# Patient Record
Sex: Male | Born: 1965
Health system: Southern US, Community
[De-identification: ages and names within clinical notes are randomized; demographics above are authoritative.]

## PROBLEM LIST (undated history)

## (undated) DIAGNOSIS — F1910 Other psychoactive substance abuse, uncomplicated: Secondary | ICD-10-CM

## (undated) DIAGNOSIS — F419 Anxiety disorder, unspecified: Secondary | ICD-10-CM

## (undated) DIAGNOSIS — F329 Major depressive disorder, single episode, unspecified: Secondary | ICD-10-CM

## (undated) DIAGNOSIS — G9341 Metabolic encephalopathy: Secondary | ICD-10-CM

## (undated) DIAGNOSIS — F32A Depression, unspecified: Secondary | ICD-10-CM

---
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
## 2018-07-16 ENCOUNTER — Emergency Department (HOSPITAL_COMMUNITY): Payer: Self-pay

## 2018-07-16 ENCOUNTER — Other Ambulatory Visit: Payer: Self-pay

## 2018-07-16 ENCOUNTER — Emergency Department (HOSPITAL_COMMUNITY)
Admission: EM | Admit: 2018-07-16 | Discharge: 2018-07-16 | Disposition: A | Discharge status: Home / Self Care | Payer: Self-pay | Attending: Emergency Medicine | Admitting: Emergency Medicine

## 2018-07-16 ENCOUNTER — Encounter (HOSPITAL_COMMUNITY): Payer: Self-pay | Admitting: Emergency Medicine

## 2018-07-16 DIAGNOSIS — F17210 Nicotine dependence, cigarettes, uncomplicated: Secondary | ICD-10-CM | POA: Insufficient documentation

## 2018-07-16 DIAGNOSIS — T50904A Poisoning by unspecified drugs, medicaments and biological substances, undetermined, initial encounter: Secondary | ICD-10-CM | POA: Insufficient documentation

## 2018-07-16 DIAGNOSIS — R4689 Other symptoms and signs involving appearance and behavior: Secondary | ICD-10-CM | POA: Insufficient documentation

## 2018-07-16 HISTORY — DX: Depression, unspecified: F32.A

## 2018-07-16 HISTORY — DX: Anxiety disorder, unspecified: F41.9

## 2018-07-16 LAB — URINALYSIS, ROUTINE W REFLEX MICROSCOPIC
Bilirubin Urine: NEGATIVE
Glucose, UA: NEGATIVE mg/dL
Hgb urine dipstick: NEGATIVE
Ketones, ur: NEGATIVE mg/dL
Leukocytes,Ua: NEGATIVE
Nitrite: NEGATIVE
Protein, ur: NEGATIVE mg/dL
Specific Gravity, Urine: 1.006 (ref 1.005–1.030)
pH: 8 (ref 5.0–8.0)

## 2018-07-16 LAB — CBC
HCT: 36.4 % — ABNORMAL LOW (ref 39.0–52.0)
Hemoglobin: 12.6 g/dL — ABNORMAL LOW (ref 13.0–17.0)
MCH: 39.6 pg — ABNORMAL HIGH (ref 26.0–34.0)
MCHC: 34.6 g/dL (ref 30.0–36.0)
MCV: 114.5 fL — ABNORMAL HIGH (ref 80.0–100.0)
Platelets: 205 10*3/uL (ref 150–400)
RBC: 3.18 MIL/uL — ABNORMAL LOW (ref 4.22–5.81)
RDW: 15.6 % — ABNORMAL HIGH (ref 11.5–15.5)
WBC: 7.4 10*3/uL (ref 4.0–10.5)
nRBC: 0 % (ref 0.0–0.2)

## 2018-07-16 LAB — COMPREHENSIVE METABOLIC PANEL
ALT: 18 U/L (ref 0–44)
AST: 28 U/L (ref 15–41)
Albumin: 4.5 g/dL (ref 3.5–5.0)
Alkaline Phosphatase: 51 U/L (ref 38–126)
Anion gap: 11 (ref 5–15)
BUN: 6 mg/dL (ref 6–20)
CO2: 21 mmol/L — ABNORMAL LOW (ref 22–32)
Calcium: 9.2 mg/dL (ref 8.9–10.3)
Chloride: 115 mmol/L — ABNORMAL HIGH (ref 98–111)
Creatinine, Ser: 0.82 mg/dL (ref 0.61–1.24)
GFR calc Af Amer: >60 mL/min (ref >60)
GFR calc non Af Amer: >60 mL/min (ref >60)
Glucose, Bld: 113 mg/dL — ABNORMAL HIGH (ref 70–99)
Potassium: 4 mmol/L (ref 3.5–5.1)
Sodium: 147 mmol/L — ABNORMAL HIGH (ref 135–145)
Total Bilirubin: 1 mg/dL (ref 0.3–1.2)
Total Protein: 7.7 g/dL (ref 6.5–8.1)

## 2018-07-16 LAB — LACTIC ACID, PLASMA: Lactic Acid, Venous: 3.3 mmol/L (ref 0.5–1.9)

## 2018-07-16 LAB — RAPID URINE DRUG SCREEN, HOSP PERFORMED
Amphetamines: NOT DETECTED
Barbiturates: NOT DETECTED
Benzodiazepines: NOT DETECTED
Cocaine: POSITIVE — AB
Opiates: NOT DETECTED
Tetrahydrocannabinol: POSITIVE — AB

## 2018-07-16 LAB — ACETAMINOPHEN LEVEL: Acetaminophen (Tylenol), Serum: <10 ug/mL — ABNORMAL LOW (ref 10–30)

## 2018-07-16 LAB — SALICYLATE LEVEL: Salicylate Lvl: <7 mg/dL (ref 2.8–30.0)

## 2018-07-16 LAB — ETHANOL: Alcohol, Ethyl (B): 47 mg/dL — ABNORMAL HIGH (ref <10)

## 2018-07-16 MED ORDER — SODIUM CHLORIDE 0.9 % IV BOLUS: 1000.0000 mL | Freq: Once | INTRAVENOUS | Status: DC

## 2018-07-16 NOTE — ED Notes (Signed)
Patient ambulated independently to bathroom infront of nurse's station. Pt sat on the toilet for around 15 minutes before I went in to check on pt. Patient sitting on toilet with pants on. Asked patient if he needed to use the toilet and he stated "No my stomach hurts." Patient refused to pull pants down to sit on the toilet. Called security for assistance in redirecting patient back to his room. Patient is sleeping at this time.

## 2018-07-16 NOTE — ED Provider Notes (Signed)
[facility] Emergency Department Provider Note MRN:  [id number]  Arrival date & time: 07/16/18     Chief Complaint   Loss of Consciousness   History of Present Illness   John Grant is a 53 y.o. year-old male with a history of anxiety, depression presenting to the ED with chief complaint of loss of consciousness.  Patient was reportedly found minimally responsive at his home, brought in by EMS.  History of crack cocaine use years ago.  I was unable to obtain an accurate HPI, PMH, or ROS due to the patient's altered mental status.  Level 5 caveat.  Review of Systems  Positive for altered mental status.  Patient's Health History    Past Medical History:  Diagnosis Date  . Anxiety   . Depression     History reviewed. No pertinent surgical history.  History reviewed. No pertinent family history.  Social History   Socioeconomic History  . Marital status: Single    Spouse name: Not on file  . Number of children: Not on file  . Years of education: Not on file  . Highest education level: Not on file  Occupational History  . Not on file  Social Needs  . Financial resource strain: Not on file  . Food insecurity    Worry: Not on file    Inability: Not on file  . Transportation needs    Medical: Not on file    Non-medical: Not on file  Tobacco Use  . Smoking status: Heavy Tobacco Smoker    Packs/day: 0.50    Types: Cigarettes  . Smokeless tobacco: Never Used  Substance and Sexual Activity  . Alcohol use: Yes    Frequency: Never    Comment: occassionaly  . Drug use: Yes    Types: Marijuana    Comment: unknown  . Sexual activity: Not on file  Lifestyle  . Physical activity    Days per week: Not on file    Minutes per session: Not on file  . Stress: Not on file  Relationships  . Social Herbalist on phone: Not on file    Gets together: Not on file    Attends religious service: Not on file    Active member of club or organization:  Not on file    Attends meetings of clubs or organizations: Not on file    Relationship status: Not on file  . Intimate partner violence    Fear of current or ex partner: Not on file    Emotionally abused: Not on file    Physically abused: Not on file    Forced sexual activity: Not on file  Other Topics Concern  . Not on file  Social History Narrative  . Not on file     Physical Exam  Vital Signs and Nursing Notes reviewed Vitals:   07/16/18 1954 07/16/18 2000  BP: (!) 157/102 137/90  Pulse: 88 74  Resp: 19 18  Temp:    SpO2: 100% 99%    CONSTITUTIONAL: Well-appearing, NAD NEURO: Somnolent, wakes to pain stimulus, perseverating, moving all extremities EYES:  eyes equal and reactive, pupils 4 mm ENT/NECK:  no LAD, no JVD CARDIO: Regular rate, well-perfused, normal S1 and S2 PULM:  CTAB no wheezing or rhonchi GI/GU:  normal bowel sounds, non-distended, non-tender MSK/SPINE:  No gross deformities, no edema SKIN:  no rash, atraumatic PSYCH:  Appropriate speech and behavior  Diagnostic and Interventional Summary    EKG Interpretation  Date/Time:  Tuesday July 16 2018 17:12:56 EDT Ventricular Rate:  69 PR Interval:    QRS Duration: 97 QT Interval:  435 QTC Calculation: 466 R Axis:   64 Text Interpretation:  Sinus rhythm Confirmed by Kennis CarinaBero, Shawnita Krizek [phone]([id number]) on 07/16/2018 7:35:18 PM      Labs Reviewed  CBC - Abnormal; Notable for the following components:      Result Value   RBC 3.18 (*)    Hemoglobin 12.6 (*)    HCT 36.4 (*)    MCV 114.5 (*)    MCH 39.6 (*)    RDW 15.6 (*)    All other components within normal limits  COMPREHENSIVE METABOLIC PANEL - Abnormal; Notable for the following components:   Sodium 147 (*)    Chloride 115 (*)    CO2 21 (*)    Glucose, Bld 113 (*)    All other components within normal limits  LACTIC ACID, PLASMA - Abnormal; Notable for the following components:   Lactic Acid, Venous 3.3 (*)    All other components within normal limits   URINALYSIS, ROUTINE W REFLEX MICROSCOPIC - Abnormal; Notable for the following components:   Color, Urine STRAW (*)    All other components within normal limits  ETHANOL - Abnormal; Notable for the following components:   Alcohol, Ethyl (B) 47 (*)    All other components within normal limits  ACETAMINOPHEN LEVEL - Abnormal; Notable for the following components:   Acetaminophen (Tylenol), Serum <10 (*)    All other components within normal limits  RAPID URINE DRUG SCREEN, HOSP PERFORMED - Abnormal; Notable for the following components:   Cocaine POSITIVE (*)    Tetrahydrocannabinol POSITIVE (*)    All other components within normal limits  SALICYLATE LEVEL  RAPID HIV SCREEN (HIV 1/2 AB+AG)  HEPATITIS B SURFACE ANTIGEN  HEPATITIS C ANTIBODY (REFLEX)    CT HEAD WO CONTRAST  Final Result    DG Chest Port 1 View  Final Result      Medications  sodium chloride 0.9 % bolus 1,000 mL (1,000 mLs Intravenous Refused 07/16/18 1818)     Procedures Critical Care  ED Course and Medical Decision Making  I have reviewed the triage vital signs and the nursing notes.  Pertinent labs & imaging results that were available during my care of the patient were reviewed by me and considered in my medical decision making (see below for details).  Altered mental status in this 53 year old male, pupils are 4 mm, making this inconsistent with opioid toxidrome.  He has normal vital signs, no clonus, moving all extremities, currently intermittently talking but repeating himself.  Suspect drug use given his history, also considering metabolic disarray, CNS abnormality, work-up is pending.  Update 6:10 PM: Called to bedside for patient ripping out his leads and IV, I was able to question the patient who cannot tell me what year it is, still very altered, risk of harm to self, luckily we were able to convince him to stay in his bed and will monitor him closely.  Work-up reveals normal labs, normal head CT,  normal urinalysis, UDS testing positive for cocaine and THC, ethanol also slightly elevated.  Chest x-ray read as bilateral infiltrates however patient has no respiratory symptoms or cough or fever and the significance of this is questioned.  Upon reevaluation, patient is now alert and oriented, ambulating without assistance, appropriate for discharge.  Presumed drug overdose, likely unintentional, patient denying SI or HI.  After the discussed management above, the patient was  determined to be safe for discharge.  The patient was in agreement with this plan and all questions regarding their care were answered.  ED return precautions were discussed and the patient will return to the ED with any significant worsening of condition.  Elmer SowMichael M. Pilar PlateBero, MD [facility] [email or web address]  Final Clinical Impressions(s) / ED Diagnoses     ICD-10-CM   1. Drug overdose, undetermined intent, initial encounter  T50.904A   2. Altered behavior  R46.89 [facility]    [facility] [place] [facility]    ED Discharge Orders    None         Sabas SousBero, Falynn Ailey M, MD 07/16/18 2136

## 2018-07-16 NOTE — ED Notes (Signed)
Patient tolerating oral fluids  

## 2018-07-16 NOTE — ED Triage Notes (Signed)
PT brought in by [facility] today for decreased LOC. It was reported to EMS that pt had been outdoors on a porch for hours today and had walked to the store down the road around 1530 but was shaking some and appeared to be nauseated at that time per his niece. PT unable to provided any information but will respond to verbal and painful stimuli and pupils are equal and reactive. PT was diaphoretic when EMS arrived and got on the stretcher and then became less responsive in route per EMS. CBG 177 and VS WNL.

## 2018-07-16 NOTE — Discharge Instructions (Addendum)
You were evaluated in the Emergency Department and after careful evaluation, we did not find any emergent condition requiring admission or further testing in the hospital. ° °Please return to the Emergency Department if you experience any worsening of your condition.  We encourage you to follow up with a primary care provider.  Thank you for allowing us to be a part of your care. °

## 2018-07-16 NOTE — ED Notes (Signed)
Esignature pad not working in room. Patient verbally stated he understands D/C instructions

## 2018-07-16 NOTE — ED Notes (Signed)
Patient standing up yelling "I'm leaving" upon entrance into room. Attempted to redirect patient back to his room. Patient refused and continued walking out of room. Security called and EDP notified. Patient ripped IV out and was bleeding heavily out of IV site onto self, floor and staff member's clothes. Patient assisted back into bed. Patient sleeping at this time.

## 2018-07-16 NOTE — ED Notes (Signed)
Spoke with Diamond Nickel regarding pt update and current status. Levada Dy stated she would be picking pt up from ED after D/C

## 2018-07-16 NOTE — ED Notes (Signed)
Date and time results received: 07/16/18 8:30 PM  (use smartphrase ".now" to insert current time)  Test: lactic acid Critical Value: 3.3  Name of Provider Notified: Sedonia Small, MD  Orders Received? Or Actions Taken?: N/A

## 2018-07-18 LAB — HEPATITIS B SURFACE ANTIGEN: Hepatitis B Surface Ag: NEGATIVE

## 2018-07-18 LAB — HEPATITIS C ANTIBODY (REFLEX): HCV Ab: <0.1 s/co ratio (ref 0.0–0.9)

## 2018-07-18 LAB — HCV COMMENT:

## 2018-07-23 LAB — RAPID HIV SCREEN (HIV 1/2 AB+AG)
HIV 1/2 Antibodies: NONREACTIVE
HIV-1 P24 Antigen - HIV24: NONREACTIVE

## 2018-08-15 ENCOUNTER — Emergency Department (HOSPITAL_COMMUNITY): Payer: Self-pay

## 2018-08-15 ENCOUNTER — Observation Stay (HOSPITAL_COMMUNITY)
Admission: EM | Admit: 2018-08-15 | Discharge: 2018-08-16 | Disposition: A | Discharge status: Home / Self Care | Payer: Self-pay | Attending: Family Medicine | Admitting: Family Medicine

## 2018-08-15 ENCOUNTER — Other Ambulatory Visit: Payer: Self-pay

## 2018-08-15 ENCOUNTER — Encounter (HOSPITAL_COMMUNITY): Payer: Self-pay | Admitting: Emergency Medicine

## 2018-08-15 DIAGNOSIS — R4182 Altered mental status, unspecified: Secondary | ICD-10-CM

## 2018-08-15 DIAGNOSIS — Z20828 Contact with and (suspected) exposure to other viral communicable diseases: Secondary | ICD-10-CM | POA: Insufficient documentation

## 2018-08-15 DIAGNOSIS — F1410 Cocaine abuse, uncomplicated: Secondary | ICD-10-CM | POA: Insufficient documentation

## 2018-08-15 DIAGNOSIS — G9341 Metabolic encephalopathy: Principal | ICD-10-CM | POA: Insufficient documentation

## 2018-08-15 DIAGNOSIS — R400 Somnolence: Secondary | ICD-10-CM

## 2018-08-15 DIAGNOSIS — F17210 Nicotine dependence, cigarettes, uncomplicated: Secondary | ICD-10-CM | POA: Insufficient documentation

## 2018-08-15 LAB — CBC WITH DIFFERENTIAL/PLATELET
Abs Immature Granulocytes: 0.03 10*3/uL (ref 0.00–0.07)
Basophils Absolute: 0 10*3/uL (ref 0.0–0.1)
Basophils Relative: 0 %
Eosinophils Absolute: 0.1 10*3/uL (ref 0.0–0.5)
Eosinophils Relative: 1 %
HCT: 36.5 % — ABNORMAL LOW (ref 39.0–52.0)
Hemoglobin: 12.7 g/dL — ABNORMAL LOW (ref 13.0–17.0)
Immature Granulocytes: 0 %
Lymphocytes Relative: 20 %
Lymphs Abs: 2 10*3/uL (ref 0.7–4.0)
MCH: 39.6 pg — ABNORMAL HIGH (ref 26.0–34.0)
MCHC: 34.8 g/dL (ref 30.0–36.0)
MCV: 113.7 fL — ABNORMAL HIGH (ref 80.0–100.0)
Monocytes Absolute: 0.6 10*3/uL (ref 0.1–1.0)
Monocytes Relative: 6 %
Neutro Abs: 7.2 10*3/uL (ref 1.7–7.7)
Neutrophils Relative %: 73 %
Platelets: 241 10*3/uL (ref 150–400)
RBC: 3.21 MIL/uL — ABNORMAL LOW (ref 4.22–5.81)
RDW: 14.2 % (ref 11.5–15.5)
WBC: 10 10*3/uL (ref 4.0–10.5)
nRBC: 0.2 % (ref 0.0–0.2)

## 2018-08-15 LAB — SALICYLATE LEVEL: Salicylate Lvl: <7 mg/dL (ref 2.8–30.0)

## 2018-08-15 LAB — COMPREHENSIVE METABOLIC PANEL
ALT: 16 U/L (ref 0–44)
AST: 24 U/L (ref 15–41)
Albumin: 4.4 g/dL (ref 3.5–5.0)
Alkaline Phosphatase: 54 U/L (ref 38–126)
Anion gap: 11 (ref 5–15)
BUN: 8 mg/dL (ref 6–20)
CO2: 20 mmol/L — ABNORMAL LOW (ref 22–32)
Calcium: 8.9 mg/dL (ref 8.9–10.3)
Chloride: 112 mmol/L — ABNORMAL HIGH (ref 98–111)
Creatinine, Ser: 0.92 mg/dL (ref 0.61–1.24)
GFR calc Af Amer: >60 mL/min (ref >60)
GFR calc non Af Amer: >60 mL/min (ref >60)
Glucose, Bld: 121 mg/dL — ABNORMAL HIGH (ref 70–99)
Potassium: 3.4 mmol/L — ABNORMAL LOW (ref 3.5–5.1)
Sodium: 143 mmol/L (ref 135–145)
Total Bilirubin: 1.1 mg/dL (ref 0.3–1.2)
Total Protein: 7.5 g/dL (ref 6.5–8.1)

## 2018-08-15 LAB — RAPID URINE DRUG SCREEN, HOSP PERFORMED
Amphetamines: NOT DETECTED
Barbiturates: NOT DETECTED
Benzodiazepines: NOT DETECTED
Cocaine: POSITIVE — AB
Opiates: NOT DETECTED
Tetrahydrocannabinol: POSITIVE — AB

## 2018-08-15 LAB — URINALYSIS, ROUTINE W REFLEX MICROSCOPIC
Bacteria, UA: NONE SEEN
Bilirubin Urine: NEGATIVE
Glucose, UA: NEGATIVE mg/dL
Hgb urine dipstick: NEGATIVE
Ketones, ur: NEGATIVE mg/dL
Leukocytes,Ua: NEGATIVE
Nitrite: NEGATIVE
Protein, ur: 30 mg/dL — AB
Specific Gravity, Urine: 1.018 (ref 1.005–1.030)
pH: 8 (ref 5.0–8.0)

## 2018-08-15 LAB — CBG MONITORING, ED: Glucose-Capillary: 142 mg/dL — ABNORMAL HIGH (ref 70–99)

## 2018-08-15 LAB — TYPE AND SCREEN
ABO/RH(D): O POS
Antibody Screen: NEGATIVE

## 2018-08-15 LAB — ACETAMINOPHEN LEVEL: Acetaminophen (Tylenol), Serum: <10 ug/mL — ABNORMAL LOW (ref 10–30)

## 2018-08-15 LAB — ETHANOL: Alcohol, Ethyl (B): 15 mg/dL — ABNORMAL HIGH (ref <10)

## 2018-08-15 LAB — SARS CORONAVIRUS 2 BY RT PCR (HOSPITAL ORDER, PERFORMED IN ~~LOC~~ HOSPITAL LAB): SARS Coronavirus 2: NEGATIVE

## 2018-08-15 LAB — LACTIC ACID, PLASMA
Lactic Acid, Venous: 3.8 mmol/L (ref 0.5–1.9)
Lactic Acid, Venous: 2.7 mmol/L (ref 0.5–1.9)

## 2018-08-15 MED ORDER — THIAMINE HCL 100 MG/ML IJ SOLN: 100.0000 mg | Freq: Every day | INTRAMUSCULAR | Status: DC

## 2018-08-15 MED ORDER — POLYETHYLENE GLYCOL 3350 17 G PO PACK: 17.0000 g | PACK | Freq: Every day | ORAL | Status: DC | PRN

## 2018-08-15 MED ORDER — ONDANSETRON HCL 4 MG PO TABS: 4.0000 mg | ORAL_TABLET | Freq: Four times a day (QID) | ORAL | Status: DC | PRN

## 2018-08-15 MED ORDER — METHOCARBAMOL 500 MG PO TABS
750.0000 mg | ORAL_TABLET | Freq: Four times a day (QID) | ORAL | Status: DC
  Administered 2018-08-15 – 2018-08-16 (×5): 750 mg via ORAL
  Filled 2018-08-15 (×6): qty 2

## 2018-08-15 MED ORDER — POLYETHYLENE GLYCOL 3350 17 G PO PACK
17.0000 g | PACK | Freq: Every day | ORAL | Status: DC
  Administered 2018-08-16: 17 g via ORAL
  Filled 2018-08-15: qty 1

## 2018-08-15 MED ORDER — ACETAMINOPHEN 650 MG RE SUPP: 650.0000 mg | Freq: Four times a day (QID) | RECTAL | Status: DC | PRN

## 2018-08-15 MED ORDER — GABAPENTIN 300 MG PO CAPS
300.0000 mg | ORAL_CAPSULE | Freq: Three times a day (TID) | ORAL | Status: DC
  Administered 2018-08-15 – 2018-08-16 (×4): 300 mg via ORAL
  Filled 2018-08-15 (×4): qty 1

## 2018-08-15 MED ORDER — LORAZEPAM 2 MG/ML IJ SOLN
2.0000 mg | Freq: Once | INTRAMUSCULAR | Status: AC
  Administered 2018-08-15: 2 mg via INTRAVENOUS
  Filled 2018-08-15: qty 1

## 2018-08-15 MED ORDER — TRAZODONE HCL 50 MG PO TABS: 50.0000 mg | ORAL_TABLET | Freq: Every evening | ORAL | Status: DC | PRN

## 2018-08-15 MED ORDER — SODIUM CHLORIDE 0.9 % IV SOLN
INTRAVENOUS | Status: DC
  Administered 2018-08-15: 14:00:00 via INTRAVENOUS

## 2018-08-15 MED ORDER — HYDROXYZINE HCL 25 MG PO TABS
25.0000 mg | ORAL_TABLET | Freq: Three times a day (TID) | ORAL | Status: DC
  Administered 2018-08-15 – 2018-08-16 (×4): 25 mg via ORAL
  Filled 2018-08-15 (×4): qty 1

## 2018-08-15 MED ORDER — MAGNESIUM SULFATE 2 GM/50ML IV SOLN
2.0000 g | Freq: Once | INTRAVENOUS | Status: AC
  Administered 2018-08-15: 19:00:00 2 g via INTRAVENOUS
  Filled 2018-08-15: qty 50

## 2018-08-15 MED ORDER — SODIUM CHLORIDE 0.9 % IV SOLN: 250.0000 mL | INTRAVENOUS | Status: DC | PRN

## 2018-08-15 MED ORDER — ADULT MULTIVITAMIN W/MINERALS CH
1.0000 | ORAL_TABLET | Freq: Every day | ORAL | Status: DC
  Administered 2018-08-16: 1 via ORAL
  Filled 2018-08-15: qty 1

## 2018-08-15 MED ORDER — ACETAMINOPHEN 325 MG PO TABS: 650.0000 mg | ORAL_TABLET | Freq: Four times a day (QID) | ORAL | Status: DC | PRN

## 2018-08-15 MED ORDER — ALBUTEROL SULFATE (2.5 MG/3ML) 0.083% IN NEBU: 2.5000 mg | INHALATION_SOLUTION | RESPIRATORY_TRACT | Status: DC | PRN

## 2018-08-15 MED ORDER — ONDANSETRON HCL 4 MG/2ML IJ SOLN: 4.0000 mg | Freq: Four times a day (QID) | INTRAMUSCULAR | Status: DC | PRN

## 2018-08-15 MED ORDER — LORAZEPAM 1 MG PO TABS: 1.0000 mg | ORAL_TABLET | Freq: Four times a day (QID) | ORAL | Status: DC | PRN

## 2018-08-15 MED ORDER — TRAZODONE HCL 50 MG PO TABS
150.0000 mg | ORAL_TABLET | Freq: Every day | ORAL | Status: DC
  Administered 2018-08-15: 22:00:00 150 mg via ORAL
  Filled 2018-08-15: qty 3

## 2018-08-15 MED ORDER — VITAMIN B-1 100 MG PO TABS
100.0000 mg | ORAL_TABLET | Freq: Every day | ORAL | Status: DC
  Administered 2018-08-16: 100 mg via ORAL
  Filled 2018-08-15: qty 1

## 2018-08-15 MED ORDER — HEPARIN SODIUM (PORCINE) 5000 UNIT/ML IJ SOLN
5000.0000 [IU] | Freq: Three times a day (TID) | INTRAMUSCULAR | Status: DC
  Administered 2018-08-15 – 2018-08-16 (×3): 5000 [IU] via SUBCUTANEOUS
  Filled 2018-08-15 (×3): qty 1

## 2018-08-15 MED ORDER — KCL IN DEXTROSE-NACL 20-5-0.45 MEQ/L-%-% IV SOLN
INTRAVENOUS | Status: DC
  Administered 2018-08-15 – 2018-08-16 (×4): via INTRAVENOUS
  Filled 2018-08-15 (×16): qty 1000

## 2018-08-15 MED ORDER — PANTOPRAZOLE SODIUM 40 MG IV SOLR
40.0000 mg | Freq: Once | INTRAVENOUS | Status: AC
  Administered 2018-08-15: 40 mg via INTRAVENOUS
  Filled 2018-08-15: qty 40

## 2018-08-15 MED ORDER — LORAZEPAM 2 MG/ML IJ SOLN: 1.0000 mg | Freq: Four times a day (QID) | INTRAMUSCULAR | Status: DC | PRN

## 2018-08-15 MED ORDER — SODIUM CHLORIDE 0.9% FLUSH
3.0000 mL | Freq: Two times a day (BID) | INTRAVENOUS | Status: DC
  Administered 2018-08-16: 3 mL via INTRAVENOUS

## 2018-08-15 MED ORDER — FOLIC ACID 1 MG PO TABS
1.0000 mg | ORAL_TABLET | Freq: Every day | ORAL | Status: DC
  Administered 2018-08-16: 10:00:00 1 mg via ORAL
  Filled 2018-08-15: qty 1

## 2018-08-15 MED ORDER — SENNOSIDES-DOCUSATE SODIUM 8.6-50 MG PO TABS
2.0000 | ORAL_TABLET | Freq: Two times a day (BID) | ORAL | Status: DC
  Administered 2018-08-16: 2 via ORAL
  Filled 2018-08-15 (×9): qty 2

## 2018-08-15 MED ORDER — SODIUM CHLORIDE 0.9% FLUSH
3.0000 mL | INTRAVENOUS | Status: DC | PRN
  Administered 2018-08-15 (×2): 3 mL via INTRAVENOUS
  Filled 2018-08-15 (×2): qty 3

## 2018-08-15 NOTE — ED Notes (Signed)
Date and time results received: 08/15/18 1259 (use smartphrase ".now" to insert current time)  Test: lactic Critical Value: 3.8  Name of Provider Notified: kohut,md  Orders Received? Or Actions Taken?:

## 2018-08-15 NOTE — ED Provider Notes (Signed)
[facility] EMERGENCY DEPARTMENT Provider Note   CSN: [id number] Arrival date & time: 08/15/18  1142     History   Chief Complaint Chief Complaint  Patient presents with  . Altered Mental Status    HPI John Grant is a 53 y.o. male.     HPI  53yM with AMS. Apparently was in normal state of health when he went out to mow the lawn. Found inside a short time later just standing there. Wouldn't speak and acting bizarrely so EMS was called. Reported history of ETOH and drug abuse. Of note, Pt was seen in the ER on 07/16/18 with altered mental status presumed to be substance related.   Past Medical History:  Diagnosis Date  . Anxiety   . Depression    There are no active problems to display for this patient.  History reviewed. No pertinent surgical history.    Home Medications    Prior to Admission medications   Not on File    Family History History reviewed. No pertinent family history.  Social History Social History   Tobacco Use  . Smoking status: Heavy Tobacco Smoker    Packs/day: 0.50    Types: Cigarettes  . Smokeless tobacco: Never Used  Substance Use Topics  . Alcohol use: Yes    Frequency: Never    Comment: daily  . Drug use: Yes    Types: Marijuana    Comment: unknown     Allergies   Patient has no known allergies.   Review of Systems Review of Systems  Level 5 caveat because pt is nonverbal.  Physical Exam Updated Vital Signs BP (!) 155/98   Pulse 72   Temp 98.3 F (36.8 C)   Resp 16   SpO2 99%   Physical Exam Vitals signs and nursing note reviewed.  Constitutional:      Appearance: He is well-developed. He is diaphoretic.  HENT:     Head: Normocephalic.     Comments: L upper incisor broken at gumline. R upper incisor cracked. Blood noted on lips/gums but no clear source aside from possibly teeth. Pt clenching teeth hard whenever I attempt to manually open mouth.  Eyes:     General:        Right eye: No discharge.        Left  eye: No discharge.     Conjunctiva/sclera: Conjunctivae normal.  Neck:     Musculoskeletal: Neck supple.  Cardiovascular:     Rate and Rhythm: Normal rate and regular rhythm.     Heart sounds: Normal heart sounds. No murmur. No friction rub. No gallop.   Pulmonary:     Effort: Pulmonary effort is normal. No respiratory distress.     Breath sounds: Normal breath sounds.  Abdominal:     General: There is no distension.     Palpations: Abdomen is soft.     Tenderness: There is no abdominal tenderness.  Musculoskeletal:        General: No tenderness.  Skin:    General: Skin is warm.  Neurological:     Comments: Fighting strongly with all extremities when catheterizing for urine. Not following commands. Not speaking. Not making eye contact but clearly moves eyes past midline.       ED Treatments / Results  Labs (all labs ordered are listed, but only abnormal results are displayed) Labs Reviewed  COMPREHENSIVE METABOLIC PANEL - Abnormal; Notable for the following components:      Result Value   Potassium 3.4 (*)  Chloride 112 (*)    CO2 20 (*)    Glucose, Bld 121 (*)    All other components within normal limits  RAPID URINE DRUG SCREEN, HOSP PERFORMED - Abnormal; Notable for the following components:   Cocaine POSITIVE (*)    Tetrahydrocannabinol POSITIVE (*)    All other components within normal limits  URINALYSIS, ROUTINE W REFLEX MICROSCOPIC - Abnormal; Notable for the following components:   Protein, ur 30 (*)    All other components within normal limits  CBC WITH DIFFERENTIAL/PLATELET - Abnormal; Notable for the following components:   RBC 3.21 (*)    Hemoglobin 12.7 (*)    HCT 36.5 (*)    MCV 113.7 (*)    MCH 39.6 (*)    All other components within normal limits  ACETAMINOPHEN LEVEL - Abnormal; Notable for the following components:   Acetaminophen (Tylenol), Serum <10 (*)    All other components within normal limits  LACTIC ACID, PLASMA - Abnormal; Notable for  the following components:   Lactic Acid, Venous 3.8 (*)    All other components within normal limits  ETHANOL - Abnormal; Notable for the following components:   Alcohol, Ethyl (B) 15 (*)    All other components within normal limits  CBG MONITORING, ED - Abnormal; Notable for the following components:   Glucose-Capillary 142 (*)    All other components within normal limits  SARS CORONAVIRUS 2 (HOSPITAL ORDER, PERFORMED IN [facility])  SALICYLATE LEVEL  LACTIC ACID, PLASMA  TYPE AND SCREEN    EKG EKG Interpretation  Date/Time:  Thursday August 15 2018 11:45:59 EDT Ventricular Rate:  101 PR Interval:    QRS Duration: 82 QT Interval:  356 QTC Calculation: 462 R Axis:   47 Text Interpretation:  Sinus tachycardia Baseline wander in lead(s) V1 Confirmed by Raeford RazorKohut, Clover Feehan [phone]([id number]) on 08/15/2018 12:40:31 PM   Radiology Ct Head Wo Contrast  Result Date: 08/15/2018 CLINICAL DATA:  Altered mental status. EXAM: CT HEAD WITHOUT CONTRAST TECHNIQUE: Contiguous axial images were obtained from the base of the skull through the vertex without intravenous contrast. COMPARISON:  07/16/2018 FINDINGS: Brain: The ventricles are normal in size and configuration. No extra-axial fluid collections are identified. The gray-white differentiation is maintained. No CT findings for acute hemispheric infarction or intracranial hemorrhage. No mass lesions. The brainstem and cerebellum are normal. Vascular: No hyperdense vessels or obvious aneurysm. Skull: No acute skull fracture.  No bone lesion. Sinuses/Orbits: The paranasal sinuses and mastoid air cells are clear. The globes are intact. Other: No scalp lesions, laceration or hematoma. IMPRESSION: Normal head CT. Electronically Signed   By: Rudie MeyerP.  Gallerani M.D.   On: 08/15/2018 13:12    Procedures Procedures (including critical care time)  Medications Ordered in ED Medications  LORazepam (ATIVAN) injection 2 mg (has no administration in time range)   pantoprazole (PROTONIX) injection 40 mg (has no administration in time range)     Initial Impression / Assessment and Plan / ED Course  I have reviewed the triage vital signs and the nursing notes.  Pertinent labs & imaging results that were available during my care of the patient were reviewed by me and considered in my medical decision making (see chart for details).     53yM with AMS. Not much additional history aside from hx of substance abuse and ED visit one month ago with confusion felt to be from drug ingestion.  On arrival pt apparently vomited blood. Over 100cc of dark blood noted in suction  canister. Pt clenching teeth and I cannot open his mouth. Possibly acute trauma to upper incisors but amount of blood seems out of proportion to this. Will have to sedate for imaging and will try to better assess for intraoral trauma. Reported history of ETOH abuse. Consider UGIB/varices. At this point I do not have a clear source. Will obtain T&S and give a dose of protonix for now.   He is currently very altered. Moves all extremities with good strength but nonverbal and not following commands. Not making eye contact but eyes move past midline. Afebrile. Glucose fine.   W/u fairly unremarkable aside from UDS and etoh barely elevated. I suspect this is either substance induced or possibly a seizure. He remains very drowsy but protecting his airway. Had to give him ativan to obtain imaging. Will admit for observation. I still can't identified source of bleeding. I don't think it's his teeth. On repeat exam it just looks like very poor dentition. I don't see any blood intraorally. No further blood from that initial episode once her arrived to the ED.   Updated his sister John Grant, [phone]. She couldn't provide additional history as to what may have happened today.  Final Clinical Impressions(s) / ED Diagnoses   Final diagnoses:  Somnolence  Altered mental status, unspecified altered  mental status type    ED Discharge Orders    None       Virgel Manifold, MD 08/16/18 [phone]

## 2018-08-15 NOTE — ED Notes (Signed)
Date and time results received: 08/15/18 1519 (use smartphrase ".now" to insert current time)  Test: lactic acid Critical Value: 2.7  Name of Provider Notified: Kohut,MD  Orders Received? Or Actions Taken?:

## 2018-08-15 NOTE — ED Notes (Signed)
Pt's wallet, bracelet, one earring given to security

## 2018-08-15 NOTE — ED Notes (Signed)
Pt's brother at bedside, doesn't know what happened today, unsure of PMH

## 2018-08-15 NOTE — ED Triage Notes (Signed)
Pt from home with mother. Mother called EMS for altered mental status. Pt was mowing grass, went inside and was standing, not talking and altered. HX of substance abuse and alcohol

## 2018-08-15 NOTE — ED Notes (Signed)
ED Provider at bedside. 

## 2018-08-15 NOTE — H&P (Signed)
Patient Demographics:    John Grant, is a 53 y.o. male  MRN: [id number]   DOB - [birth year]  Admit Date - 08/15/2018  Outpatient Primary MD for the patient is Patient, No Pcp Per   Assessment & Plan:    Principal Problem:   Acute metabolic encephalopathy Active Problems:   Cocaine abuse (HCC)    1) acute toxic metabolic encephalopathy--- suspect due to substance abuse, monitor closely, UDS positive for cocaine, -Hydrate until awake enough to take oral intake -N.p.o. while lethargic --Consider seizure work-up if lethargy and altered mentation persist after hydration with passage of time Check EEG  2) polysubstance abuse including cocaine--- okay to use methocarbamol, hydroxyzine, and gabapentin to help with withdrawal symptoms  3) history of alcohol abuse--blood alcohol level was noted, give multivitamin and lorazepam per CIWA protocol -High risk for DTs -Trazodone for sleep  With History of - Reviewed by me  Past Medical History:  Diagnosis Date  . Anxiety   . Depression       History reviewed. No pertinent surgical history.   Chief Complaint  Patient presents with  . Altered Mental Status      HPI:    John StandingHenry Grant  is a 53 y.o. male history of alcohol abuse as well as polysubstance abuse including cocaine recently seen here in the ED on 07/16/2018 with altered mentation returns again today with bizarre behavior and altered mental status  --UDS positive for cocaine and THC, blood alcohol level was noted --CT head without contrast from 07/16/2018 and 08/15/2018 without acute findings --Lactic acid was elevated at 3.8 came down to 2.7 with hydration -Salicylate and Tylenol levels are not elevated, hemoglobin is 12.7, -Potassium down to 3.4, magnesium not available   Review of systems:    In  addition to the HPI above,   A full Review of  Systems was done, all other systems reviewed are negative except as noted above in HPI , .   Social History:  Reviewed by me    Social History   Tobacco Use  . Smoking status: Heavy Tobacco Smoker    Packs/day: 0.50    Types: Cigarettes  . Smokeless tobacco: Never Used  Substance Use Topics  . Alcohol use: Yes    Frequency: Never    Comment: daily       Family History :  Reviewed by me  HTN  Home Medications:   Prior to Admission medications   Medication Sig Start Date End Date Taking? Authorizing Provider  citalopram (CELEXA) 20 MG tablet Take 20 mg by mouth daily.    [provider]  traZODone (DESYREL) 100 MG tablet Take 100 mg by mouth at bedtime as needed for sleep.    [provider]     Allergies:    No Known Allergies   Physical Exam:   Vitals  Blood pressure (!) 143/93, pulse 84, temperature 98.3 F (36.8 C), resp. rate 16, SpO2 100 %.  Physical Examination: General appearance -sleepy but arousable mental status -sleepy  neck - supple, no JVD elevation , Chest - clear  to auscultation bilaterally, symmetrical air movement,  Heart - S1 and S2 normal, regular  Abdomen - soft, nontender, nondistended, no masses or organomegaly Neurological -Limited exam as patient is lethargic but he is moving all extremities well,  extremities - no pedal edema noted, intact peripheral pulses  Skin - warm, dry   Data Review:    CBC Recent Labs  Lab 08/15/18 1217  WBC 10.0  HGB 12.7*  HCT 36.5*  PLT 241  MCV 113.7*  MCH 39.6*  MCHC 34.8  RDW 14.2  LYMPHSABS 2.0  MONOABS 0.6  EOSABS 0.1  BASOSABS 0.0   ------------------------------------------------------------------------------------------------------------------  Chemistries  Recent Labs  Lab 08/15/18 1217  NA 143  K 3.4*  CL 112*  CO2 20*  GLUCOSE 121*  BUN 8  CREATININE 0.92  CALCIUM 8.9  AST 24  ALT 16  ALKPHOS 54   BILITOT 1.1   ------------------------------------------------------------------------------------------------------------------ CrCl cannot be calculated (Unknown ideal weight.). ------------------------------------------------------------------------------------------------------------------ No results for input(s): TSH, T4TOTAL, T3FREE, THYROIDAB in the last 72 hours.  Invalid input(s): FREET3   Coagulation profile No results for input(s): INR, PROTIME in the last 168 hours. ------------------------------------------------------------------------------------------------------------------- No results for input(s): DDIMER in the last 72 hours. -------------------------------------------------------------------------------------------------------------------  Cardiac Enzymes No results for input(s): CKMB, TROPONINI, MYOGLOBIN in the last 168 hours.  Invalid input(s): CK ------------------------------------------------------------------------------------------------------------------ No results found for: BNP   ---------------------------------------------------------------------------------------------------------------  Urinalysis    Component Value Date/Time   COLORURINE YELLOW 08/15/2018 1158   APPEARANCEUR CLEAR 08/15/2018 1158   LABSPEC 1.018 08/15/2018 1158   PHURINE 8.0 08/15/2018 1158   GLUCOSEU NEGATIVE 08/15/2018 1158   HGBUR NEGATIVE 08/15/2018 1158   BILIRUBINUR NEGATIVE 08/15/2018 1158   KETONESUR NEGATIVE 08/15/2018 1158   PROTEINUR 30 (A) 08/15/2018 1158   NITRITE NEGATIVE 08/15/2018 1158   LEUKOCYTESUR NEGATIVE 08/15/2018 1158    ----------------------------------------------------------------------------------------------------------------   Imaging Results:    Ct Head Wo Contrast  Result Date: 08/15/2018 CLINICAL DATA:  Altered mental status. EXAM: CT HEAD WITHOUT CONTRAST TECHNIQUE: Contiguous axial images were obtained from the base of the skull  through the vertex without intravenous contrast. COMPARISON:  07/16/2018 FINDINGS: Brain: The ventricles are normal in size and configuration. No extra-axial fluid collections are identified. The gray-white differentiation is maintained. No CT findings for acute hemispheric infarction or intracranial hemorrhage. No mass lesions. The brainstem and cerebellum are normal. Vascular: No hyperdense vessels or obvious aneurysm. Skull: No acute skull fracture.  No bone lesion. Sinuses/Orbits: The paranasal sinuses and mastoid air cells are clear. The globes are intact. Other: No scalp lesions, laceration or hematoma. IMPRESSION: Normal head CT. Electronically Signed   By: Rudie MeyerP.  Gallerani M.D.   On: 08/15/2018 13:12    Radiological Exams on Admission: Ct Head Wo Contrast  Result Date: 08/15/2018 CLINICAL DATA:  Altered mental status. EXAM: CT HEAD WITHOUT CONTRAST TECHNIQUE: Contiguous axial images were obtained from the base of the skull through the vertex without intravenous contrast. COMPARISON:  07/16/2018 FINDINGS: Brain: The ventricles are normal in size and configuration. No extra-axial fluid collections are identified. The gray-white differentiation is maintained. No CT findings for acute hemispheric infarction or intracranial hemorrhage. No mass lesions. The brainstem and cerebellum are normal. Vascular: No hyperdense vessels or obvious aneurysm. Skull: No acute skull fracture.  No bone lesion. Sinuses/Orbits: The paranasal sinuses and mastoid air cells are clear. The globes are intact. Other: No scalp lesions,  laceration or hematoma. IMPRESSION: Normal head CT. Electronically Signed   By: Marijo Sanes M.D.   On: 08/15/2018 13:12    DVT Prophylaxis -SCD  AM Labs Ordered, also please review Full Orders  Family Communication: Admission, patients condition and plan of care including tests being ordered have been discussed with the patient who is currently lethargic  Code Status - Full Code  Likely DC to  home  Condition   stable  Roxan Hockey M.D on 08/15/2018 at 6:29 PM Go to [email or web address] -  for contact info  [facility] - Office  [phone]

## 2018-08-16 ENCOUNTER — Observation Stay (HOSPITAL_COMMUNITY)
Admit: 2018-08-16 | Discharge: 2018-08-16 | Disposition: A | Discharge status: Home / Self Care | Payer: Self-pay | Attending: Family Medicine | Admitting: Family Medicine

## 2018-08-16 DIAGNOSIS — G9341 Metabolic encephalopathy: Secondary | ICD-10-CM

## 2018-08-16 LAB — CBC
HCT: 34.8 % — ABNORMAL LOW (ref 39.0–52.0)
Hemoglobin: 11.8 g/dL — ABNORMAL LOW (ref 13.0–17.0)
MCH: 39.9 pg — ABNORMAL HIGH (ref 26.0–34.0)
MCHC: 33.9 g/dL (ref 30.0–36.0)
MCV: 117.6 fL — ABNORMAL HIGH (ref 80.0–100.0)
Platelets: 215 10*3/uL (ref 150–400)
RBC: 2.96 MIL/uL — ABNORMAL LOW (ref 4.22–5.81)
RDW: 14.5 % (ref 11.5–15.5)
WBC: 6.9 10*3/uL (ref 4.0–10.5)
nRBC: 0.3 % — ABNORMAL HIGH (ref 0.0–0.2)

## 2018-08-16 LAB — BASIC METABOLIC PANEL
Anion gap: 7 (ref 5–15)
BUN: 6 mg/dL (ref 6–20)
CO2: 19 mmol/L — ABNORMAL LOW (ref 22–32)
Calcium: 8.5 mg/dL — ABNORMAL LOW (ref 8.9–10.3)
Chloride: 115 mmol/L — ABNORMAL HIGH (ref 98–111)
Creatinine, Ser: 0.85 mg/dL (ref 0.61–1.24)
GFR calc Af Amer: >60 mL/min (ref >60)
GFR calc non Af Amer: >60 mL/min (ref >60)
Glucose, Bld: 113 mg/dL — ABNORMAL HIGH (ref 70–99)
Potassium: 3.8 mmol/L (ref 3.5–5.1)
Sodium: 141 mmol/L (ref 135–145)

## 2018-08-16 MED ORDER — CITALOPRAM HYDROBROMIDE 20 MG PO TABS: 20.0000 mg | ORAL_TABLET | Freq: Every day | ORAL | 5 refills | Status: DC

## 2018-08-16 MED ORDER — TRAZODONE HCL 100 MG PO TABS: 100.0000 mg | ORAL_TABLET | Freq: Every day | ORAL | 5 refills | Status: DC

## 2018-08-16 MED ORDER — ADULT MULTIVITAMIN W/MINERALS CH: 1.0000 | ORAL_TABLET | Freq: Every day | ORAL | 2 refills | Status: AC

## 2018-08-16 NOTE — Procedures (Signed)
Patient Name: John Grant  MRN: [id number]  Epilepsy Attending: Lora Havens  Referring Physician/Provider: Dr Roxan Hockey Date: 08/16/2018  Duration: 25.07 mins  Patient history:  53yo M with ams. EEG to evaluate for seizure  Level of alertness: awake, drowsy  Technical aspects: This EEG study was done with scalp electrodes positioned according to the 10-20 International system of electrode placement. Electrical activity was acquired at a sampling rate of 500Hz  and reviewed with a high frequency filter of 70Hz  and a low frequency filter of 1Hz . EEG data were recorded continuously and digitally stored.   DESCRIPTION:  The posterior dominant rhythm consists of 8-9 Hz activity of moderate voltage (25-35 uV) seen predominantly in posterior head regions, symmetric and reactive to eye opening and eye closing.  Drowsiness was characterized by attenuation of the posterior background rhythm. Hyperventilation and photic stimulation were not performed.  IMPRESSION: This study is within normal limits. However, only wakefulness and drowsiness were recorded. If suspicion for interictal activity remains a concern, a prolonged study including sleep should be considered.  No seizures or epileptiform discharges were seen throughout the recording.   [name] [name]

## 2018-08-16 NOTE — Progress Notes (Signed)
IV removed and discharge instructions reviewed.    

## 2018-08-16 NOTE — Discharge Instructions (Signed)
1) abstinence from alcohol, tobacco and cocaine strongly advised

## 2018-08-16 NOTE — Progress Notes (Signed)
EEG complete - results pending 

## 2018-08-16 NOTE — Discharge Summary (Addendum)
John Grant, is a 53 y.o. male  DOB [birth year]  MRN [id number].  Admission date:  08/15/2018  Admitting Physician  Shon Haleourage Freddi Forster, MD  Discharge Date:  08/16/2018   Primary MD  Patient, No Pcp Per  Recommendations for primary care physician for things to follow:   1) abstinence from alcohol, tobacco and cocaine strongly advised   Admission Diagnosis  Somnolence [R40.0] Altered mental status, unspecified altered mental status type [R41.82]   Discharge Diagnosis  Somnolence [R40.0] Altered mental status, unspecified altered mental status type [R41.82]    Principal Problem:   Acute metabolic encephalopathy Active Problems:   Cocaine abuse (HCC)      Past Medical History:  Diagnosis Date  . Anxiety   . Depression     History reviewed. No pertinent surgical history.     HPI  from the history and physical done on the day of admission:     John StandingHenry Merlin  is a 53 y.o. male history of alcohol abuse as well as polysubstance abuse including cocaine recently seen here in the ED on 07/16/2018 with altered mentation returns again today with bizarre behavior and altered mental status  --UDS positive for cocaine and THC, blood alcohol level was noted --CT head without contrast from 07/16/2018 and 08/15/2018 without acute findings --Lactic acid was elevated at 3.8 came down to 2.7 with hydration -Salicylate and Tylenol levels are not elevated, hemoglobin is 12.7, -Potassium down to 3.4, magnesium not available    Hospital Course:        1) acute toxic metabolic encephalopathy--- suspect due to substance abuse,  UDS positive for cocaine, -EEG without acute findings -CT head without acute findings -Patient is back to baseline, coherent and neurologically intact -On telemetry monitor no significant arrhythmias reported  2) polysubstance abuse including cocaine---  abstinence from street drugs  strongly advised  3) history of alcohol abuse--abstinence advised   4) depression/anxiety--- stable, use Celexa during the day and trazodone nightly  discharge Condition: Stable,  Follow UP--PCP 1 to 2 weeks for recheck  Diet and Activity recommendation:  As advised  Discharge Instructions    Discharge Instructions    Call MD for:  difficulty breathing, headache or visual disturbances   Complete by: As directed    Call MD for:  persistant dizziness or light-headedness   Complete by: As directed    Call MD for:  persistant nausea and vomiting   Complete by: As directed    Call MD for:  temperature >100.4   Complete by: As directed    Diet - low sodium heart healthy   Complete by: As directed    Discharge instructions   Complete by: As directed    1) abstinence from alcohol, tobacco and cocaine strongly advised   Increase activity slowly   Complete by: As directed         Discharge Medications     Allergies as of 08/16/2018   No Known Allergies     Medication List    TAKE these medications  citalopram 20 MG tablet Commonly known as: CELEXA Take 1 tablet (20 mg total) by mouth daily.   multivitamin with minerals Tabs tablet Take 1 tablet by mouth daily. Start taking on: August 17, 2018   traZODone 100 MG tablet Commonly known as: DESYREL Take 1 tablet (100 mg total) by mouth at bedtime. What changed:   when to take this  reasons to take this       Major procedures and Radiology Reports - PLEASE review detailed and final reports for all details, in brief -   Ct Head Wo Contrast  Result Date: 08/15/2018 CLINICAL DATA:  Altered mental status. EXAM: CT HEAD WITHOUT CONTRAST TECHNIQUE: Contiguous axial images were obtained from the base of the skull through the vertex without intravenous contrast. COMPARISON:  07/16/2018 FINDINGS: Brain: The ventricles are normal in size and configuration. No extra-axial fluid collections are identified. The gray-white  differentiation is maintained. No CT findings for acute hemispheric infarction or intracranial hemorrhage. No mass lesions. The brainstem and cerebellum are normal. Vascular: No hyperdense vessels or obvious aneurysm. Skull: No acute skull fracture.  No bone lesion. Sinuses/Orbits: The paranasal sinuses and mastoid air cells are clear. The globes are intact. Other: No scalp lesions, laceration or hematoma. IMPRESSION: Normal head CT. Electronically Signed   By: Rudie MeyerP.  Gallerani M.D.   On: 08/15/2018 13:12    Micro Results   Recent Results (from the past 240 hour(s))  SARS Coronavirus 2 [facility](Hospital order, [facility]) Nasopharyngeal Nasopharyngeal Swab     Status: None   Collection Time: 08/15/18 12:23 PM   Specimen: Nasopharyngeal Swab  Result Value Ref Range Status   SARS Coronavirus 2 NEGATIVE NEGATIVE Final    Comment: (NOTE) If result is NEGATIVE SARS-CoV-2 target nucleic acids are NOT DETECTED. The SARS-CoV-2 RNA is generally detectable in upper and lower  respiratory specimens during the acute phase of infection. The lowest  concentration of SARS-CoV-2 viral copies this assay can detect is 250  copies / mL. A negative result does not preclude SARS-CoV-2 infection  and should not be used as the sole basis for treatment or other  patient management decisions.  A negative result may occur with  improper specimen collection / handling, submission of specimen other  than nasopharyngeal swab, presence of viral mutation(s) within the  areas targeted by this assay, and inadequate number of viral copies  (<250 copies / mL). A negative result must be combined with clinical  observations, patient history, and epidemiological information. If result is POSITIVE SARS-CoV-2 target nucleic acids are DETECTED. The SARS-CoV-2 RNA is generally detectable in upper and lower  respiratory specimens dur ing the acute phase of infection.  Positive  results are indicative of active  infection with SARS-CoV-2.  Clinical  correlation with patient history and other diagnostic information is  necessary to determine patient infection status.  Positive results do  not rule out bacterial infection or co-infection with other viruses. If result is PRESUMPTIVE POSTIVE SARS-CoV-2 nucleic acids MAY BE PRESENT.   A presumptive positive result was obtained on the submitted specimen  and confirmed on repeat testing.  While 2019 novel coronavirus  (SARS-CoV-2) nucleic acids may be present in the submitted sample  additional confirmatory testing may be necessary for epidemiological  and / or clinical management purposes  to differentiate between  SARS-CoV-2 and other Sarbecovirus currently known to infect humans.  If clinically indicated additional testing with an alternate test  methodology [id number]) is advised. The SARS-CoV-2 RNA is  generally  detectable in upper and lower respiratory sp ecimens during the acute  phase of infection. The expected result is Negative. Fact Sheet for Patients:  StrictlyIdeas.no Fact Sheet for Healthcare Providers: BankingDealers.co.za This test is not yet approved or cleared by the [place] FDA and has been authorized for detection and/or diagnosis of SARS-CoV-2 by FDA under an Emergency Use Authorization (EUA).  This EUA will remain in effect (meaning this test can be used) for the duration of the COVID-19 declaration under Section 564(b)(1) of the Act, 21 U.S.C. section 360bbb-3(b)(1), unless the authorization is terminated or revoked sooner. Performed at [facility], [address], [place], [place] [address]        Today   [place] today has no new complaints No fever  Or chills   No Nausea, Vomiting or Diarrhea  eating and drinking well, ambulating without any problems          Patient has been seen and examined prior to discharge   Objective   Blood  pressure (!) 148/88, pulse 63, temperature 98.1 F (36.7 C), temperature source Oral, resp. rate 20, height 5\' 6"  (1.676 m), weight 74.8 kg, SpO2 100 %.  No intake or output data in the 24 hours ending 08/16/18 1718  Exam Gen:- Awake Alert, no acute distress  HEENT:- [place].AT, No sclera icterus Neck-Supple Neck,No JVD,.  Lungs-  CTAB , good air movement bilaterally  CV- S1, S2 normal, regular Abd-  +ve B.Sounds, Abd Soft, No tenderness,    Extremity/Skin:- No  edema,   good pulses Psych-affect is appropriate, oriented x3 Neuro-no new focal deficits, no tremors    Data Review   CBC w Diff:  Lab Results  Component Value Date   WBC 6.9 08/16/2018   HGB 11.8 (L) 08/16/2018   HCT 34.8 (L) 08/16/2018   PLT 215 08/16/2018   LYMPHOPCT 20 08/15/2018   MONOPCT 6 08/15/2018   EOSPCT 1 08/15/2018   BASOPCT 0 08/15/2018    CMP:  Lab Results  Component Value Date   NA 141 08/16/2018   K 3.8 08/16/2018   CL 115 (H) 08/16/2018   CO2 19 (L) 08/16/2018   BUN 6 08/16/2018   CREATININE 0.85 08/16/2018   PROT 7.5 08/15/2018   ALBUMIN 4.4 08/15/2018   BILITOT 1.1 08/15/2018   ALKPHOS 54 08/15/2018   AST 24 08/15/2018   ALT 16 08/15/2018  .   Total Discharge time is about 33 minutes  Roxan Hockey M.D on 08/16/2018 at 5:18 PM  Go to [email or web address] -  for contact info  [facility] - Office  [phone]

## 2019-01-23 ENCOUNTER — Other Ambulatory Visit: Payer: Self-pay

## 2019-01-23 ENCOUNTER — Emergency Department (HOSPITAL_COMMUNITY): Payer: Self-pay

## 2019-01-23 ENCOUNTER — Inpatient Hospital Stay (HOSPITAL_COMMUNITY)
Admission: EM | Admit: 2019-01-23 | Discharge: 2019-01-25 | DRG: 896 | Disposition: A | Discharge status: Home / Self Care | Payer: Self-pay | Attending: Family Medicine | Admitting: Family Medicine

## 2019-01-23 ENCOUNTER — Encounter (HOSPITAL_COMMUNITY): Payer: Self-pay

## 2019-01-23 DIAGNOSIS — R030 Elevated blood-pressure reading, without diagnosis of hypertension: Secondary | ICD-10-CM | POA: Diagnosis present

## 2019-01-23 DIAGNOSIS — F1910 Other psychoactive substance abuse, uncomplicated: Secondary | ICD-10-CM | POA: Diagnosis present

## 2019-01-23 DIAGNOSIS — K7200 Acute and subacute hepatic failure without coma: Secondary | ICD-10-CM | POA: Diagnosis present

## 2019-01-23 DIAGNOSIS — Z79899 Other long term (current) drug therapy: Secondary | ICD-10-CM

## 2019-01-23 DIAGNOSIS — K7040 Alcoholic hepatic failure without coma: Secondary | ICD-10-CM | POA: Diagnosis present

## 2019-01-23 DIAGNOSIS — F1410 Cocaine abuse, uncomplicated: Secondary | ICD-10-CM | POA: Diagnosis present

## 2019-01-23 DIAGNOSIS — F17210 Nicotine dependence, cigarettes, uncomplicated: Secondary | ICD-10-CM | POA: Diagnosis present

## 2019-01-23 DIAGNOSIS — G9340 Encephalopathy, unspecified: Secondary | ICD-10-CM

## 2019-01-23 DIAGNOSIS — G9341 Metabolic encephalopathy: Secondary | ICD-10-CM

## 2019-01-23 DIAGNOSIS — F10931 Alcohol use, unspecified with withdrawal delirium: Secondary | ICD-10-CM | POA: Diagnosis present

## 2019-01-23 DIAGNOSIS — Z20822 Contact with and (suspected) exposure to covid-19: Secondary | ICD-10-CM | POA: Diagnosis present

## 2019-01-23 DIAGNOSIS — K7682 Hepatic encephalopathy: Secondary | ICD-10-CM

## 2019-01-23 DIAGNOSIS — F418 Other specified anxiety disorders: Secondary | ICD-10-CM | POA: Diagnosis present

## 2019-01-23 DIAGNOSIS — K709 Alcoholic liver disease, unspecified: Secondary | ICD-10-CM | POA: Diagnosis present

## 2019-01-23 DIAGNOSIS — F10231 Alcohol dependence with withdrawal delirium: Principal | ICD-10-CM | POA: Diagnosis present

## 2019-01-23 DIAGNOSIS — F1210 Cannabis abuse, uncomplicated: Secondary | ICD-10-CM | POA: Diagnosis present

## 2019-01-23 DIAGNOSIS — K7290 Hepatic failure, unspecified without coma: Secondary | ICD-10-CM

## 2019-01-23 LAB — CBC WITH DIFFERENTIAL/PLATELET
Abs Immature Granulocytes: 0.02 10*3/uL (ref 0.00–0.07)
Basophils Absolute: 0 10*3/uL (ref 0.0–0.1)
Basophils Relative: 1 %
Eosinophils Absolute: 0.1 10*3/uL (ref 0.0–0.5)
Eosinophils Relative: 1 %
HCT: 42.7 % (ref 39.0–52.0)
Hemoglobin: 14.3 g/dL (ref 13.0–17.0)
Immature Granulocytes: 0 %
Lymphocytes Relative: 28 %
Lymphs Abs: 2.1 10*3/uL (ref 0.7–4.0)
MCH: 36.1 pg — ABNORMAL HIGH (ref 26.0–34.0)
MCHC: 33.5 g/dL (ref 30.0–36.0)
MCV: 107.8 fL — ABNORMAL HIGH (ref 80.0–100.0)
Monocytes Absolute: 0.4 10*3/uL (ref 0.1–1.0)
Monocytes Relative: 6 %
Neutro Abs: 4.8 10*3/uL (ref 1.7–7.7)
Neutrophils Relative %: 64 %
Platelets: 296 10*3/uL (ref 150–400)
RBC: 3.96 MIL/uL — ABNORMAL LOW (ref 4.22–5.81)
RDW: 15.4 % (ref 11.5–15.5)
WBC: 7.4 10*3/uL (ref 4.0–10.5)
nRBC: 0 % (ref 0.0–0.2)

## 2019-01-23 LAB — COMPREHENSIVE METABOLIC PANEL
ALT: 25 U/L (ref 0–44)
ALT: 25 U/L (ref 0–44)
AST: 33 U/L (ref 15–41)
AST: 34 U/L (ref 15–41)
Albumin: 4.5 g/dL (ref 3.5–5.0)
Albumin: 4.3 g/dL (ref 3.5–5.0)
Alkaline Phosphatase: 65 U/L (ref 38–126)
Alkaline Phosphatase: 62 U/L (ref 38–126)
Anion gap: 12 (ref 5–15)
Anion gap: 10 (ref 5–15)
BUN: 5 mg/dL — ABNORMAL LOW (ref 6–20)
BUN: 5 mg/dL — ABNORMAL LOW (ref 6–20)
CO2: 20 mmol/L — ABNORMAL LOW (ref 22–32)
CO2: 18 mmol/L — ABNORMAL LOW (ref 22–32)
Calcium: 9.2 mg/dL (ref 8.9–10.3)
Calcium: 8.6 mg/dL — ABNORMAL LOW (ref 8.9–10.3)
Chloride: 111 mmol/L (ref 98–111)
Chloride: 112 mmol/L — ABNORMAL HIGH (ref 98–111)
Creatinine, Ser: 0.79 mg/dL (ref 0.61–1.24)
Creatinine, Ser: 0.78 mg/dL (ref 0.61–1.24)
GFR calc Af Amer: >60 mL/min (ref >60)
GFR calc Af Amer: >60 mL/min (ref >60)
GFR calc non Af Amer: >60 mL/min (ref >60)
GFR calc non Af Amer: >60 mL/min (ref >60)
Glucose, Bld: 119 mg/dL — ABNORMAL HIGH (ref 70–99)
Glucose, Bld: 106 mg/dL — ABNORMAL HIGH (ref 70–99)
Potassium: 3.6 mmol/L (ref 3.5–5.1)
Potassium: 3.5 mmol/L (ref 3.5–5.1)
Sodium: 143 mmol/L (ref 135–145)
Sodium: 140 mmol/L (ref 135–145)
Total Bilirubin: 1.3 mg/dL — ABNORMAL HIGH (ref 0.3–1.2)
Total Bilirubin: 1.2 mg/dL (ref 0.3–1.2)
Total Protein: 8.3 g/dL — ABNORMAL HIGH (ref 6.5–8.1)
Total Protein: 7.5 g/dL (ref 6.5–8.1)

## 2019-01-23 LAB — URINALYSIS, ROUTINE W REFLEX MICROSCOPIC
Bilirubin Urine: NEGATIVE
Glucose, UA: NEGATIVE mg/dL
Hgb urine dipstick: NEGATIVE
Ketones, ur: NEGATIVE mg/dL
Leukocytes,Ua: NEGATIVE
Nitrite: NEGATIVE
Protein, ur: NEGATIVE mg/dL
Specific Gravity, Urine: 1.011 (ref 1.005–1.030)
pH: 7 (ref 5.0–8.0)

## 2019-01-23 LAB — CBC
HCT: 41.5 % (ref 39.0–52.0)
Hemoglobin: 13.6 g/dL (ref 13.0–17.0)
MCH: 35.5 pg — ABNORMAL HIGH (ref 26.0–34.0)
MCHC: 32.8 g/dL (ref 30.0–36.0)
MCV: 108.4 fL — ABNORMAL HIGH (ref 80.0–100.0)
Platelets: 265 10*3/uL (ref 150–400)
RBC: 3.83 MIL/uL — ABNORMAL LOW (ref 4.22–5.81)
RDW: 15.4 % (ref 11.5–15.5)
WBC: 9.5 10*3/uL (ref 4.0–10.5)
nRBC: 0 % (ref 0.0–0.2)

## 2019-01-23 LAB — RAPID URINE DRUG SCREEN, HOSP PERFORMED
Amphetamines: NOT DETECTED
Barbiturates: NOT DETECTED
Benzodiazepines: NOT DETECTED
Cocaine: POSITIVE — AB
Opiates: NOT DETECTED
Tetrahydrocannabinol: POSITIVE — AB

## 2019-01-23 LAB — ETHANOL: Alcohol, Ethyl (B): 64 mg/dL — ABNORMAL HIGH (ref <10)

## 2019-01-23 LAB — PHOSPHORUS: Phosphorus: 3 mg/dL (ref 2.5–4.6)

## 2019-01-23 LAB — GLUCOSE, CAPILLARY: Glucose-Capillary: 105 mg/dL — ABNORMAL HIGH (ref 70–99)

## 2019-01-23 LAB — SALICYLATE LEVEL: Salicylate Lvl: <7 mg/dL — ABNORMAL LOW (ref 7.0–30.0)

## 2019-01-23 LAB — ACETAMINOPHEN LEVEL: Acetaminophen (Tylenol), Serum: <10 ug/mL — ABNORMAL LOW (ref 10–30)

## 2019-01-23 LAB — MAGNESIUM: Magnesium: 1.9 mg/dL (ref 1.7–2.4)

## 2019-01-23 LAB — RESPIRATORY PANEL BY RT PCR (FLU A&B, COVID)
Influenza A by PCR: NEGATIVE
Influenza B by PCR: NEGATIVE
SARS Coronavirus 2 by RT PCR: NEGATIVE

## 2019-01-23 LAB — AMMONIA: Ammonia: 118 umol/L — ABNORMAL HIGH (ref 9–35)

## 2019-01-23 MED ORDER — ACETAMINOPHEN 325 MG PO TABS: 650.0000 mg | ORAL_TABLET | Freq: Four times a day (QID) | ORAL | Status: DC | PRN

## 2019-01-23 MED ORDER — POLYETHYLENE GLYCOL 3350 17 G PO PACK: 17.0000 g | PACK | Freq: Every day | ORAL | Status: DC | PRN

## 2019-01-23 MED ORDER — ONDANSETRON HCL 4 MG/2ML IJ SOLN: 4.0000 mg | Freq: Four times a day (QID) | INTRAMUSCULAR | Status: DC | PRN

## 2019-01-23 MED ORDER — HEPARIN SODIUM (PORCINE) 5000 UNIT/ML IJ SOLN
5000.0000 [IU] | Freq: Three times a day (TID) | INTRAMUSCULAR | Status: DC
  Administered 2019-01-23 – 2019-01-25 (×5): 5000 [IU] via SUBCUTANEOUS
  Filled 2019-01-23 (×6): qty 1

## 2019-01-23 MED ORDER — LORAZEPAM 2 MG/ML IJ SOLN
1.0000 mg | INTRAMUSCULAR | Status: DC | PRN
  Administered 2019-01-23 – 2019-01-24 (×3): 2 mg via INTRAVENOUS
  Administered 2019-01-24: 1 mg via INTRAVENOUS
  Filled 2019-01-23 (×2): qty 1

## 2019-01-23 MED ORDER — LORAZEPAM 2 MG/ML IJ SOLN
INTRAMUSCULAR | Status: AC
  Filled 2019-01-23: qty 1

## 2019-01-23 MED ORDER — FOLIC ACID 1 MG PO TABS
1.0000 mg | ORAL_TABLET | Freq: Every day | ORAL | Status: DC
  Administered 2019-01-24 – 2019-01-25 (×2): 1 mg via ORAL
  Filled 2019-01-23 (×2): qty 1

## 2019-01-23 MED ORDER — THIAMINE HCL 100 MG/ML IJ SOLN
Freq: Once | INTRAVENOUS | Status: AC
  Filled 2019-01-23: qty 1000

## 2019-01-23 MED ORDER — ADULT MULTIVITAMIN W/MINERALS CH
1.0000 | ORAL_TABLET | Freq: Every day | ORAL | Status: DC
  Administered 2019-01-24 – 2019-01-25 (×2): 1 via ORAL
  Filled 2019-01-23 (×2): qty 1

## 2019-01-23 MED ORDER — SODIUM CHLORIDE 0.9 % IV SOLN: 250.0000 mL | INTRAVENOUS | Status: DC | PRN

## 2019-01-23 MED ORDER — ADULT MULTIVITAMIN W/MINERALS CH: 1.0000 | ORAL_TABLET | Freq: Every day | ORAL | Status: DC

## 2019-01-23 MED ORDER — LORAZEPAM 2 MG/ML IJ SOLN: 1.0000 mg | Freq: Once | INTRAMUSCULAR | Status: DC

## 2019-01-23 MED ORDER — LORAZEPAM 2 MG/ML IJ SOLN
2.0000 mg | Freq: Once | INTRAMUSCULAR | Status: AC
  Administered 2019-01-23: 10:00:00 2 mg via INTRAVENOUS

## 2019-01-23 MED ORDER — NALOXONE HCL 2 MG/2ML IJ SOSY
PREFILLED_SYRINGE | INTRAMUSCULAR | Status: AC
  Filled 2019-01-23: qty 2

## 2019-01-23 MED ORDER — KCL IN DEXTROSE-NACL 20-5-0.45 MEQ/L-%-% IV SOLN: INTRAVENOUS | Status: DC

## 2019-01-23 MED ORDER — LORAZEPAM 2 MG/ML IJ SOLN: 0.0000 mg | Freq: Two times a day (BID) | INTRAMUSCULAR | Status: DC

## 2019-01-23 MED ORDER — THIAMINE HCL 100 MG/ML IJ SOLN
100.0000 mg | Freq: Every day | INTRAMUSCULAR | Status: DC
  Administered 2019-01-25: 11:00:00 100 mg via INTRAVENOUS
  Filled 2019-01-23 (×2): qty 2

## 2019-01-23 MED ORDER — SODIUM CHLORIDE 0.9% FLUSH
3.0000 mL | Freq: Two times a day (BID) | INTRAVENOUS | Status: DC
  Administered 2019-01-23 – 2019-01-25 (×4): 3 mL via INTRAVENOUS

## 2019-01-23 MED ORDER — LORAZEPAM 1 MG PO TABS: 1.0000 mg | ORAL_TABLET | ORAL | Status: DC | PRN

## 2019-01-23 MED ORDER — ONDANSETRON HCL 4 MG PO TABS: 4.0000 mg | ORAL_TABLET | Freq: Four times a day (QID) | ORAL | Status: DC | PRN

## 2019-01-23 MED ORDER — THIAMINE HCL 100 MG PO TABS
100.0000 mg | ORAL_TABLET | Freq: Every day | ORAL | Status: DC
  Administered 2019-01-24: 100 mg via ORAL
  Filled 2019-01-23: qty 1

## 2019-01-23 MED ORDER — LACTULOSE 10 GM/15ML PO SOLN
30.0000 g | ORAL | Status: AC
  Administered 2019-01-23: 30 g via ORAL
  Filled 2019-01-23: qty 60

## 2019-01-23 MED ORDER — LORAZEPAM 2 MG/ML IJ SOLN
0.0000 mg | Freq: Four times a day (QID) | INTRAMUSCULAR | Status: DC
  Administered 2019-01-24: 1 mg via INTRAVENOUS
  Administered 2019-01-24: 2 mg via INTRAVENOUS
  Filled 2019-01-23 (×4): qty 1

## 2019-01-23 MED ORDER — ALBUTEROL SULFATE (2.5 MG/3ML) 0.083% IN NEBU: 2.5000 mg | INHALATION_SOLUTION | RESPIRATORY_TRACT | Status: DC | PRN

## 2019-01-23 MED ORDER — NALOXONE HCL 2 MG/2ML IJ SOSY
1.0000 mg | PREFILLED_SYRINGE | Freq: Once | INTRAMUSCULAR | Status: AC
  Administered 2019-01-23: 1 mg via INTRAVENOUS

## 2019-01-23 MED ORDER — TRAZODONE HCL 50 MG PO TABS: 50.0000 mg | ORAL_TABLET | Freq: Every evening | ORAL | Status: DC | PRN

## 2019-01-23 MED ORDER — SODIUM CHLORIDE 0.9% FLUSH: 3.0000 mL | INTRAVENOUS | Status: DC | PRN

## 2019-01-23 MED ORDER — LACTULOSE ENEMA
300.0000 mL | Freq: Once | ORAL | Status: AC
  Administered 2019-01-23: 300 mL via RECTAL
  Filled 2019-01-23: qty 300

## 2019-01-23 MED ORDER — SODIUM CHLORIDE 0.9 % IV BOLUS
1000.0000 mL | Freq: Once | INTRAVENOUS | Status: AC
  Administered 2019-01-23: 1000 mL via INTRAVENOUS

## 2019-01-23 MED ORDER — ACETAMINOPHEN 650 MG RE SUPP: 650.0000 mg | Freq: Four times a day (QID) | RECTAL | Status: DC | PRN

## 2019-01-23 NOTE — ED Triage Notes (Signed)
EMS reports family found pt laying in floor beside his bed and called ems.  EMS says family says pt has history of mental illness and says pt may have been drinking last night.  Pt initially was unresponsive when ems arrived but became responsive during iv stick.  PT was incontinent of urine.  Pt moaning, eyes open at times.  CBG 136,  bp 148/104, o2 sat 100 ra, hr 95, rr 20.  EMS started 20g iv in left hand.

## 2019-01-23 NOTE — ED Notes (Signed)
Pt given lactulose edema per orders pt to hold in for 30 to 60 minutes

## 2019-01-23 NOTE — ED Provider Notes (Addendum)
[facility] EMERGENCY DEPARTMENT Provider Note   CSN: [id number] Arrival date & time: 01/23/19  0846     History Chief Complaint  Patient presents with  . Altered Mental Status    John Grant is a 54 y.o. male.  The history is provided by the patient. No language interpreter was used.  Altered Mental Status Presenting symptoms: behavior changes, disorientation and unresponsiveness   Severity:  Severe Most recent episode:  Today Duration:  1 day Timing:  Constant Progression:  Worsening Chronicity:  New Associated symptoms: abnormal movement   Associated symptoms: no vomiting and no weakness    Pt had a episode of syncope in front of his Grandmother.  Pt's sister reports pt drinks some but not a lot.  Pt has a history of druguse.  She reports pt has had a lot of grief in his life recently.  Pt lost an uncle and his son went to jail.  Sister reports no concerns of suicide attempt/overdose     Past Medical History:  Diagnosis Date  . Anxiety   . Depression     Patient Active Problem List   Diagnosis Date Noted  . Acute metabolic encephalopathy 83/15/1761  . Cocaine abuse ([place]) 08/15/2018    History reviewed. No pertinent surgical history.     No family history on file.  Social History   Tobacco Use  . Smoking status: Heavy Tobacco Smoker    Packs/day: 1.00    Types: Cigarettes  . Smokeless tobacco: Never Used  Substance Use Topics  . Alcohol use: Yes    Comment: daily  . Drug use: Yes    Types: Marijuana    Comment: unknown    Home Medications Prior to Admission medications   Medication Sig Start Date End Date Taking? Authorizing Provider  citalopram (CELEXA) 20 MG tablet Take 1 tablet (20 mg total) by mouth daily. 08/16/18   Roxan Hockey, MD  Multiple Vitamin (MULTIVITAMIN WITH MINERALS) TABS tablet Take 1 tablet by mouth daily. 08/17/18   Roxan Hockey, MD  traZODone (DESYREL) 100 MG tablet Take 1 tablet (100 mg total) by mouth at bedtime.  08/16/18   Roxan Hockey, MD    Allergies    Patient has no known allergies.  Review of Systems   Review of Systems  Gastrointestinal: Negative for vomiting.  Neurological: Negative for weakness.  All other systems reviewed and are negative.   Physical Exam Updated Vital Signs BP (!) 150/94   Pulse 65   Temp 98.4 F (36.9 C) (Oral)   Resp 14   SpO2 100%   Physical Exam Vitals and nursing note reviewed.  Constitutional:      Appearance: He is well-developed.  HENT:     Head: Normocephalic and atraumatic.  Eyes:     Conjunctiva/sclera: Conjunctivae normal.  Cardiovascular:     Rate and Rhythm: Normal rate and regular rhythm.     Heart sounds: No murmur.  Pulmonary:     Effort: Pulmonary effort is normal. No respiratory distress.     Breath sounds: Normal breath sounds.  Abdominal:     Palpations: Abdomen is soft.     Tenderness: There is no abdominal tenderness.  Musculoskeletal:     Cervical back: Neck supple.  Skin:    General: Skin is warm and dry.  Neurological:     Mental Status: He is alert. He is disoriented.     Comments: Pt jerks up and then lays down repeatively .    Psychiatric:  Comments: Pt has teeth clinched.       ED Results / Procedures / Treatments   Labs (all labs ordered are listed, but only abnormal results are displayed) Labs Reviewed  CBC WITH DIFFERENTIAL/PLATELET - Abnormal; Notable for the following components:      Result Value   RBC 3.96 (*)    MCV 107.8 (*)    MCH 36.1 (*)    All other components within normal limits  COMPREHENSIVE METABOLIC PANEL - Abnormal; Notable for the following components:   CO2 20 (*)    Glucose, Bld 119 (*)    BUN 5 (*)    Total Protein 8.3 (*)    Total Bilirubin 1.3 (*)    All other components within normal limits  AMMONIA - Abnormal; Notable for the following components:   Ammonia 118 (*)    All other components within normal limits  ETHANOL - Abnormal; Notable for the following  components:   Alcohol, Ethyl (B) 64 (*)    All other components within normal limits  ACETAMINOPHEN LEVEL - Abnormal; Notable for the following components:   Acetaminophen (Tylenol), Serum <10 (*)    All other components within normal limits  SALICYLATE LEVEL - Abnormal; Notable for the following components:   Salicylate Lvl <7.0 (*)    All other components within normal limits  RESPIRATORY PANEL BY RT PCR (FLU A&B, COVID)  URINALYSIS, ROUTINE W REFLEX MICROSCOPIC  RAPID URINE DRUG SCREEN, HOSP PERFORMED    EKG EKG Interpretation  Date/Time:  Thursday January 23 2019 08:55:35 EST Ventricular Rate:  87 PR Interval:    QRS Duration: 93 QT Interval:  397 QTC Calculation: 478 R Axis:   56 Text Interpretation: Sinus rhythm Borderline prolonged QT interval Confirmed by Benjiman Core [phone]) on 01/23/2019 9:18:26 AM   Radiology CT Head Wo Contrast  Result Date: 01/23/2019 CLINICAL DATA:  Mental status change, alcohol/drug use. EXAM: CT HEAD WITHOUT CONTRAST TECHNIQUE: Contiguous axial images were obtained from the base of the skull through the vertex without intravenous contrast. COMPARISON:  Head CT 08/15/2018 FINDINGS: Brain: No evidence of acute intracranial hemorrhage. No demarcated cortical infarction. No evidence of intracranial mass. No midline shift or extra-axial fluid collection. Cerebral volume is normal for age. Vascular: No hyperdense vessel. Skull: Normal. Negative for fracture or focal lesion. Sinuses/Orbits: Chronic deformities of the bilateral lamina papyracea. Orbits otherwise unremarkable. Minimal mucosal thickening within the left frontal sinus and within bilateral ethmoid air cells. No significant mastoid effusion. Other: Unchanged small region of swelling/induration within the right frontal scalp (series 2, image 21). IMPRESSION: No evidence of acute intracranial abnormality. Unchanged small region of swelling/induration within the right frontal scalp. Correlate with  direct visualization. Electronically Signed   By: Jackey Loge DO   On: 01/23/2019 10:08   DG Chest Portable 1 View  Result Date: 01/23/2019 CLINICAL DATA:  Altered mental status EXAM: PORTABLE CHEST 1 VIEW COMPARISON:  July 16 2018 FINDINGS: There is slight atelectasis in the right base. The lungs elsewhere are clear. Heart size and pulmonary vascularity are normal. No adenopathy. No bone lesions. IMPRESSION: Slight right base atelectasis. Lungs elsewhere clear. Cardiac silhouette within normal limits. No evident adenopathy. Electronically Signed   By: Bretta Bang III M.D.   On: 01/23/2019 09:37    Procedures .Critical Care Performed by: Elson Areas, PA-C Authorized by: Elson Areas, PA-C   Critical care provider statement:    Critical care time (minutes):  45   Critical care start time:  01/23/2019 9:00 AM   Critical care end time:  01/23/2019 11:45 AM   Critical care time was exclusive of:  Separately billable procedures and treating other patients   Critical care was necessary to treat or prevent imminent or life-threatening deterioration of the following conditions:  CNS failure or compromise   Critical care was time spent personally by me on the following activities:  Blood draw for specimens, development of treatment plan with patient or surrogate, discussions with consultants, discussions with primary provider, evaluation of patient's response to treatment, examination of patient, interpretation of cardiac output measurements, obtaining history from patient or surrogate, review of old charts, re-evaluation of patient's condition, pulse oximetry, ordering and review of radiographic studies and ordering and review of laboratory studies   I assumed direction of critical care for this patient from another provider in my specialty: no     (including critical care time)  Medications Ordered in ED Medications  sodium chloride 0.9 % bolus 1,000 mL (has no administration in time  range)  lactulose (CHRONULAC) enema 200 gm (has no administration in time range)  naloxone [facility]) injection 1 mg ( Intravenous Not Given 01/23/19 0937)  LORazepam (ATIVAN) injection 2 mg ( Intravenous Not Given 01/23/19 0936)    ED Course  I have reviewed the triage vital signs and the nursing notes.  Pertinent labs & imaging results that were available during my care of the patient were reviewed by me and considered in my medical decision making (see chart for details).    MDM Rules/Calculators/A&P                      MDM  Pt given narcan on arrival,  No change in behavior.  Due to history of syncope, Pt given ativan for sedation in order to obtain ct scan.  Ct shows no sign of cva.  Chest xray no acute abnormality Ammonia level elevated to  118 Etoh 64.  Ct head no acute abnormality.  Covid negative.   Pt's sister Joycelyn Man [phone] I spoke to Dr. Mariea Clonts who will see for admission  Final Clinical Impression(s) / ED Diagnoses Final diagnoses:  Encephalopathy  Hepatic encephalopathy [facility])    Rx / DC Orders ED Discharge Orders    None       Osie Cheeks 01/23/19 1256    Benjiman Core, MD 01/23/19 1411    Elson Areas, PA-C 02/04/19 0707    Elson Areas, PA-C 02/04/19 9924    Benjiman Core, MD 02/05/19 [phone]

## 2019-01-23 NOTE — Progress Notes (Signed)
Consult request has been received. CSW attempting to follow up at present time  Arnetta Odeh M. Laylynn Campanella LCSWA Transitions of Care  Clinical Social Worker  Ph: [phone] 

## 2019-01-23 NOTE — ED Notes (Signed)
No change in the patient's condition after narcan given per orders will continue to monitor closely

## 2019-01-23 NOTE — H&P (Signed)
Patient Demographics:    John Grant, is a 54 y.o. male  MRN: [id number]   DOB - [birth year]  Admit Date - 01/23/2019  Outpatient Primary MD for the patient is Patient, No Pcp Per   Assessment & Plan:    Principal Problem:   Acute metabolic encephalopathy Active Problems:   Polysubstance abuse (HCC)    1)1) acute toxic metabolic/hepatic encephalopathy--- suspect due to substance abuse, monitor closely, UDS positive for cocaine and THC -Give lactulose for elevated ammonia level, LFTs not particularly impressive, -Hydrate until awake enough to take oral intake -N.p.o. while lethargic --Consider seizure work-up if lethargy and altered mentation persist after hydration with passage of time -Patient was seen in the ED for similar presentation back in July 2020 and again in August 2020 both times with polysubstance abuse concerns -Blood alcohol level elevated at 64 -serum ammonia is 118 -MCV is elevated at 107 with MCH of 36 consistent with his history of alcohol abuse -CMP without LFT elevation, except for bilirubin of 1.3,  -Serum acetaminophen and salicylate levels are nondetectable -Serum magnesium and phosphorus are not low -CT Head without acute findings and chest x-ray without acute findings   2) polysubstance abuse including cocaine/etoh--- okay to use methocarbamol, hydroxyzine, and gabapentin to help with withdrawal symptoms when patient starts to work-up for now hold off on further sedatives  3) history of alcohol abuse--blood alcohol level was noted, give multivitamin and lorazepam per CIWA protocol -High risk for DTs -Trazodone for sleep  4) history of depression anxiety--hold off on Celexa at this time   With History of - Reviewed by me  Past Medical History:  Diagnosis Date  . Anxiety    . Depression       History reviewed. No pertinent surgical history.  Chief Complaint  Patient presents with  . Altered Mental Status      HPI:    John Grant  is a 54 y.o. male past medical history relevant for polysubstance abuse including cocaine and alcohol abuse,, history of anxiety, depression  presented to the ED with altered mentation/confusional episodes  -Patient was seen in the ED for similar presentation back in July 2020 and again in August 2020 both times with polysubstance abuse concerns  --History is limited now as patient is altered -UDS today is positive for cocaine and THC -COVID-19 negative -Blood alcohol level elevated at 64 -serum ammonia is 118 -CBC with a white count of 7.4 hemoglobin of 14.3 and platelet count of 296 MCV is elevated at 107 with MCH of 36 consistent with his history of alcohol abuse -CMP without LFT elevation, except for bilirubin of 1.3, glucose is 119 anion gap is 12 with a creatinine of 0.79 potassium is 3.6 with a sodium of 143 -Serum acetaminophen and salicylate levels are nondetectable -Serum magnesium and phosphorus are not low -CT Head without acute findings and chest x-ray without acute findings   Review of systems:  In addition to the HPI above,   A full Review of  Systems was done, all other systems reviewed are negative except as noted above in HPI , .    Social History:  Reviewed by me    Social History   Tobacco Use  . Smoking status: Heavy Tobacco Smoker    Packs/day: 1.00    Types: Cigarettes  . Smokeless tobacco: Never Used  Substance Use Topics  . Alcohol use: Yes    Comment: daily       Family History :  Reviewed by me  HTN   Home Medications:   Prior to Admission medications   Medication Sig Start Date End Date Taking? Authorizing Provider  citalopram (CELEXA) 20 MG tablet Take 1 tablet (20 mg total) by mouth daily. Patient not taking: Reported on 01/23/2019 08/16/18   Shon Hale, MD    Multiple Vitamin (MULTIVITAMIN WITH MINERALS) TABS tablet Take 1 tablet by mouth daily. Patient not taking: Reported on 01/23/2019 08/17/18   Shon Hale, MD  traZODone (DESYREL) 100 MG tablet Take 1 tablet (100 mg total) by mouth at bedtime. Patient not taking: Reported on 01/23/2019 08/16/18   Shon Hale, MD     Allergies:    No Known Allergies   Physical Exam:   Vitals  Blood pressure (!) 161/97, pulse 65, temperature 98.4 F (36.9 C), temperature source Oral, resp. rate 14, SpO2 100 %.  Physical Examination: General appearance -sleepy/lethargic mental status -lethargic, confused, very disoriented Eyes - sclera anicteric Neck - supple, no JVD elevation , Chest - clear  to auscultation bilaterally, symmetrical air movement,  Heart - S1 and S2 normal, regular  Abdomen - soft, nontender, nondistended, no masses or organomegaly Neurological -limited exam due to significant confusion/lethargy extremities - no pedal edema noted, intact peripheral pulses  Skin - warm, dry     Data Review:    CBC Recent Labs  Lab 01/23/19 1001 01/23/19 1423  WBC 7.4 9.5  HGB 14.3 13.6  HCT 42.7 41.5  PLT 296 265  MCV 107.8* 108.4*  MCH 36.1* 35.5*  MCHC 33.5 32.8  RDW 15.4 15.4  LYMPHSABS 2.1  --   MONOABS 0.4  --   EOSABS 0.1  --   BASOSABS 0.0  --    ------------------------------------------------------------------------------------------------------------------  Chemistries  Recent Labs  Lab 01/23/19 1001 01/23/19 1423  NA 143 140  K 3.6 3.5  CL 111 112*  CO2 20* 18*  GLUCOSE 119* 106*  BUN 5* 5*  CREATININE 0.79 0.78  CALCIUM 9.2 8.6*  MG  --  1.9  AST 33 34  ALT 25 25  ALKPHOS 65 62  BILITOT 1.3* 1.2   ------------------------------------------------------------------------------------------------------------------ CrCl cannot be calculated (Unknown ideal  weight.). ------------------------------------------------------------------------------------------------------------------ No results for input(s): TSH, T4TOTAL, T3FREE, THYROIDAB in the last 72 hours.  Invalid input(s): FREET3   Coagulation profile No results for input(s): INR, PROTIME in the last 168 hours. ------------------------------------------------------------------------------------------------------------------- No results for input(s): DDIMER in the last 72 hours. -------------------------------------------------------------------------------------------------------------------  Cardiac Enzymes No results for input(s): CKMB, TROPONINI, MYOGLOBIN in the last 168 hours.  Invalid input(s): CK ------------------------------------------------------------------------------------------------------------------ No results found for: BNP   ---------------------------------------------------------------------------------------------------------------  Urinalysis    Component Value Date/Time   COLORURINE YELLOW 01/23/2019 1054   APPEARANCEUR CLEAR 01/23/2019 1054   LABSPEC 1.011 01/23/2019 1054   PHURINE 7.0 01/23/2019 1054   GLUCOSEU NEGATIVE 01/23/2019 1054   HGBUR NEGATIVE 01/23/2019 1054   BILIRUBINUR NEGATIVE 01/23/2019 1054   KETONESUR NEGATIVE 01/23/2019 1054   PROTEINUR  NEGATIVE 01/23/2019 1054   NITRITE NEGATIVE 01/23/2019 1054   LEUKOCYTESUR NEGATIVE 01/23/2019 1054    ----------------------------------------------------------------------------------------------------------------   Imaging Results:    CT Head Wo Contrast  Result Date: 01/23/2019 CLINICAL DATA:  Mental status change, alcohol/drug use. EXAM: CT HEAD WITHOUT CONTRAST TECHNIQUE: Contiguous axial images were obtained from the base of the skull through the vertex without intravenous contrast. COMPARISON:  Head CT 08/15/2018 FINDINGS: Brain: No evidence of acute intracranial hemorrhage. No demarcated  cortical infarction. No evidence of intracranial mass. No midline shift or extra-axial fluid collection. Cerebral volume is normal for age. Vascular: No hyperdense vessel. Skull: Normal. Negative for fracture or focal lesion. Sinuses/Orbits: Chronic deformities of the bilateral lamina papyracea. Orbits otherwise unremarkable. Minimal mucosal thickening within the left frontal sinus and within bilateral ethmoid air cells. No significant mastoid effusion. Other: Unchanged small region of swelling/induration within the right frontal scalp (series 2, image 21). IMPRESSION: No evidence of acute intracranial abnormality. Unchanged small region of swelling/induration within the right frontal scalp. Correlate with direct visualization. Electronically Signed   By: Jackey Loge DO   On: 01/23/2019 10:08   DG Chest Portable 1 View  Result Date: 01/23/2019 CLINICAL DATA:  Altered mental status EXAM: PORTABLE CHEST 1 VIEW COMPARISON:  July 16 2018 FINDINGS: There is slight atelectasis in the right base. The lungs elsewhere are clear. Heart size and pulmonary vascularity are normal. No adenopathy. No bone lesions. IMPRESSION: Slight right base atelectasis. Lungs elsewhere clear. Cardiac silhouette within normal limits. No evident adenopathy. Electronically Signed   By: Bretta Bang III M.D.   On: 01/23/2019 09:37    Radiological Exams on Admission: CT Head Wo Contrast  Result Date: 01/23/2019 CLINICAL DATA:  Mental status change, alcohol/drug use. EXAM: CT HEAD WITHOUT CONTRAST TECHNIQUE: Contiguous axial images were obtained from the base of the skull through the vertex without intravenous contrast. COMPARISON:  Head CT 08/15/2018 FINDINGS: Brain: No evidence of acute intracranial hemorrhage. No demarcated cortical infarction. No evidence of intracranial mass. No midline shift or extra-axial fluid collection. Cerebral volume is normal for age. Vascular: No hyperdense vessel. Skull: Normal. Negative for fracture or  focal lesion. Sinuses/Orbits: Chronic deformities of the bilateral lamina papyracea. Orbits otherwise unremarkable. Minimal mucosal thickening within the left frontal sinus and within bilateral ethmoid air cells. No significant mastoid effusion. Other: Unchanged small region of swelling/induration within the right frontal scalp (series 2, image 21). IMPRESSION: No evidence of acute intracranial abnormality. Unchanged small region of swelling/induration within the right frontal scalp. Correlate with direct visualization. Electronically Signed   By: Jackey Loge DO   On: 01/23/2019 10:08   DG Chest Portable 1 View  Result Date: 01/23/2019 CLINICAL DATA:  Altered mental status EXAM: PORTABLE CHEST 1 VIEW COMPARISON:  July 16 2018 FINDINGS: There is slight atelectasis in the right base. The lungs elsewhere are clear. Heart size and pulmonary vascularity are normal. No adenopathy. No bone lesions. IMPRESSION: Slight right base atelectasis. Lungs elsewhere clear. Cardiac silhouette within normal limits. No evident adenopathy. Electronically Signed   By: Bretta Bang III M.D.   On: 01/23/2019 09:37    DVT Prophylaxis -SCD  /heparin  AM Labs Ordered, also please review Full Orders  Family Communication: Admission, patients condition and plan of care including tests being ordered have been discussed with the  who indicate understanding and agree with the plan   Code Status - Full Code  Likely DC to  TBD  Condition   Stable  Iyahna Obriant M.D  on 01/23/2019 at 7:15 PM Go to [email or web address] -  for contact info  [facility] - Office  [phone]

## 2019-01-24 DIAGNOSIS — K7682 Hepatic encephalopathy: Secondary | ICD-10-CM | POA: Diagnosis present

## 2019-01-24 DIAGNOSIS — F10931 Alcohol use, unspecified with withdrawal delirium: Secondary | ICD-10-CM | POA: Diagnosis present

## 2019-01-24 DIAGNOSIS — R030 Elevated blood-pressure reading, without diagnosis of hypertension: Secondary | ICD-10-CM | POA: Diagnosis present

## 2019-01-24 DIAGNOSIS — K7200 Acute and subacute hepatic failure without coma: Secondary | ICD-10-CM | POA: Diagnosis present

## 2019-01-24 DIAGNOSIS — F10231 Alcohol dependence with withdrawal delirium: Principal | ICD-10-CM

## 2019-01-24 LAB — COMPREHENSIVE METABOLIC PANEL
ALT: 22 U/L (ref 0–44)
AST: 29 U/L (ref 15–41)
Albumin: 3.9 g/dL (ref 3.5–5.0)
Alkaline Phosphatase: 66 U/L (ref 38–126)
Anion gap: 8 (ref 5–15)
BUN: 5 mg/dL — ABNORMAL LOW (ref 6–20)
CO2: 19 mmol/L — ABNORMAL LOW (ref 22–32)
Calcium: 9 mg/dL (ref 8.9–10.3)
Chloride: 117 mmol/L — ABNORMAL HIGH (ref 98–111)
Creatinine, Ser: 1.04 mg/dL (ref 0.61–1.24)
GFR calc Af Amer: >60 mL/min (ref >60)
GFR calc non Af Amer: >60 mL/min (ref >60)
Glucose, Bld: 112 mg/dL — ABNORMAL HIGH (ref 70–99)
Potassium: 3.6 mmol/L (ref 3.5–5.1)
Sodium: 144 mmol/L (ref 135–145)
Total Bilirubin: 2 mg/dL — ABNORMAL HIGH (ref 0.3–1.2)
Total Protein: 7.4 g/dL (ref 6.5–8.1)

## 2019-01-24 LAB — CBC
HCT: 39.1 % (ref 39.0–52.0)
Hemoglobin: 13.1 g/dL (ref 13.0–17.0)
MCH: 36.6 pg — ABNORMAL HIGH (ref 26.0–34.0)
MCHC: 33.5 g/dL (ref 30.0–36.0)
MCV: 109.2 fL — ABNORMAL HIGH (ref 80.0–100.0)
Platelets: 237 10*3/uL (ref 150–400)
RBC: 3.58 MIL/uL — ABNORMAL LOW (ref 4.22–5.81)
RDW: 15.6 % — ABNORMAL HIGH (ref 11.5–15.5)
WBC: 7.9 10*3/uL (ref 4.0–10.5)
nRBC: 0 % (ref 0.0–0.2)

## 2019-01-24 LAB — PROTIME-INR
INR: 1.2 (ref 0.8–1.2)
Prothrombin Time: 14.9 seconds (ref 11.4–15.2)

## 2019-01-24 MED ORDER — GABAPENTIN 100 MG PO CAPS
100.0000 mg | ORAL_CAPSULE | Freq: Three times a day (TID) | ORAL | Status: DC
  Administered 2019-01-24 – 2019-01-25 (×3): 100 mg via ORAL
  Filled 2019-01-24 (×3): qty 1

## 2019-01-24 MED ORDER — HYDROXYZINE HCL 25 MG PO TABS
25.0000 mg | ORAL_TABLET | Freq: Three times a day (TID) | ORAL | Status: DC
  Administered 2019-01-24 – 2019-01-25 (×3): 25 mg via ORAL
  Filled 2019-01-24 (×3): qty 1

## 2019-01-24 MED ORDER — TRAZODONE HCL 50 MG PO TABS
100.0000 mg | ORAL_TABLET | Freq: Every day | ORAL | Status: DC
  Administered 2019-01-24: 100 mg via ORAL
  Filled 2019-01-24: qty 2

## 2019-01-24 MED ORDER — LACTULOSE 10 GM/15ML PO SOLN
30.0000 g | Freq: Once | ORAL | Status: AC
  Administered 2019-01-24: 30 g via ORAL
  Filled 2019-01-24: qty 60

## 2019-01-24 MED ORDER — LORAZEPAM 2 MG/ML IJ SOLN: 0.0000 mg | Freq: Four times a day (QID) | INTRAMUSCULAR | Status: DC

## 2019-01-24 MED ORDER — LORAZEPAM 2 MG/ML IJ SOLN: 1.0000 mg | INTRAMUSCULAR | Status: DC | PRN

## 2019-01-24 MED ORDER — LORAZEPAM 2 MG/ML IJ SOLN: 0.0000 mg | Freq: Two times a day (BID) | INTRAMUSCULAR | Status: DC

## 2019-01-24 MED ORDER — LORAZEPAM 1 MG PO TABS: 1.0000 mg | ORAL_TABLET | ORAL | Status: DC | PRN

## 2019-01-24 MED ORDER — METHOCARBAMOL 500 MG PO TABS
1000.0000 mg | ORAL_TABLET | Freq: Four times a day (QID) | ORAL | Status: DC
  Administered 2019-01-24 – 2019-01-25 (×3): 1000 mg via ORAL
  Filled 2019-01-24 (×3): qty 2

## 2019-01-24 NOTE — Progress Notes (Signed)
Patient Demographics:    John Grant, is a 54 y.o. male, DOB - 1965/02/06, ZJI:[id number]  Admit date - 01/23/2019   Admitting Physician Lalonnie Shaffer Denton Brick, MD  Outpatient Primary MD for the patient is Patient, No Pcp Per  LOS - 0   Chief Complaint  Patient presents with  . Altered Mental Status        Subjective:    John Grant today has no fevers, no emesis,  No chest pain,   -Remains confused, restless at times, disoriented -Elevated BP noted  Assessment  & Plan :    Principal Problem:   DTs (delirium tremens) (HCC) Active Problems:   Acute hepatic encephalopathy   Acute metabolic encephalopathy   Cocaine abuse (HCC)   Polysubstance abuse (HCC)   Elevated BP without diagnosis of hypertension  Brief summary 54 y.o. male past medical history relevant for polysubstance abuse including cocaine and alcohol abuse,, history of anxiety, depression  admitted on 01/23/19 with altered mentation/metabolic encephalopathy with concerns for acute hepatic encephalopathy and alcohol withdrawal/DTs  A/p  1)DTs-BAL was 64 on admission patient appears to be having alcohol withdrawal at this time with elevated BP agitation restlessness and very anxious -Completed IV banana bag -Lorazepam per CIWA protocol, continue multivitamin including thiamine and folic acid  2) acute hepatic encephalopathy-ammonia on admission was 118 patient had numerous loose stools after lactulose -Recheck ammonia levels in a.m. -Suspect some degree of alcoholic liver disease  3) polysubstance abuse--UDS positive for cocaine and THC, blood alcohol level 64, -Continue lorazepam as above #1, methocarbamol, gabapentin, trazodone and hydroxyzine as ordered to help blunt withdrawal symptoms  Disposition/Need for in-Hospital Stay- patient unable to be discharged at this time due to --delirium tremens requiring IV lorazepam*  Code  Status : Full  Family Communication:    Discussed with patient's sister Ms. Rayann Heman--- questions answered  Disposition Plan  : TBD  Consults  :  Na   DVT Prophylaxis  :   - Heparin - SCDs   Lab Results  Component Value Date   PLT 237 01/24/2019    Inpatient Medications  Scheduled Meds: . folic acid  1 mg Oral Daily  . heparin  5,000 Units Subcutaneous Q8H  . lactulose  30 g Oral Once  . LORazepam  0-4 mg Intravenous Q6H   Followed by  . [START ON 01/25/2019] LORazepam  0-4 mg Intravenous Q12H  . multivitamin with minerals  1 tablet Oral Daily  . sodium chloride flush  3 mL Intravenous Q12H  . thiamine  100 mg Oral Daily   Or  . thiamine  100 mg Intravenous Daily   Continuous Infusions: . sodium chloride    . dextrose 5 % and 0.45 % NaCl with KCl 20 mEq/L 100 mL/hr at 01/24/19 1404   PRN Meds:.sodium chloride, acetaminophen **OR** acetaminophen, albuterol, LORazepam **OR** LORazepam, ondansetron **OR** ondansetron (ZOFRAN) IV, polyethylene glycol, sodium chloride flush, traZODone    Anti-infectives (From admission, onward)   None        Objective:   Vitals:   01/24/19 0735 01/24/19 0800 01/24/19 0852 01/24/19 1349  BP:  (!) 151/100 (!) 151/100 (!) 154/101  Pulse:  78 77 67  Resp:   18 20  Temp:   98.2 F (36.8  C) 97.6 F (36.4 C)  TempSrc:   Oral Oral  SpO2: 98%  100% 100%  Weight:      Height:        Wt Readings from Last 3 Encounters:  01/23/19 73 kg  08/16/18 74.8 kg  07/16/18 77.1 kg    Intake/Output Summary (Last 24 hours) at 01/24/2019 1451 Last data filed at 01/24/2019 0600 Gross per 24 hour  Intake 316.56 ml  Output --  Net 316.56 ml   Physical Exam  Gen:- Awake , confused and disoriented HEENT:- [place].AT, No sclera icterus Neck-Supple Neck,No JVD,.  Lungs-  CTAB , fair symmetrical air movement CV- S1, S2 normal, regular  Abd-  +ve B.Sounds, Abd Soft, No tenderness,    Extremity/Skin:- No  edema, pedal pulses present  Psych-affect  is appropriate, oriented x3 Neuro-generalized weakness, unsteady gait, No new focal deficits, significant tremors   Data Review:   Micro Results Recent Results (from the past 240 hour(s))  Respiratory Panel by RT PCR (Flu A&B, Covid) - Nasopharyngeal Swab     Status: None   Collection Time: 01/23/19 10:21 AM   Specimen: Nasopharyngeal Swab  Result Value Ref Range Status   SARS Coronavirus 2 by RT PCR NEGATIVE NEGATIVE Final    Comment: (NOTE) SARS-CoV-2 target nucleic acids are NOT DETECTED. The SARS-CoV-2 RNA is generally detectable in upper respiratoy specimens during the acute phase of infection. The lowest concentration of SARS-CoV-2 viral copies this assay can detect is 131 copies/mL. A negative result does not preclude SARS-Cov-2 infection and should not be used as the sole basis for treatment or other patient management decisions. A negative result may occur with  improper specimen collection/handling, submission of specimen other than nasopharyngeal swab, presence of viral mutation(s) within the areas targeted by this assay, and inadequate number of viral copies (<131 copies/mL). A negative result must be combined with clinical observations, patient history, and epidemiological information. The expected result is Negative. Fact Sheet for Patients:  https://www.moore.com/ Fact Sheet for Healthcare Providers:  https://www.young.biz/ This test is not yet ap proved or cleared by the [place] FDA and  has been authorized for detection and/or diagnosis of SARS-CoV-2 by FDA under an Emergency Use Authorization (EUA). This EUA will remain  in effect (meaning this test can be used) for the duration of the COVID-19 declaration under Section 564(b)(1) of the Act, 21 U.S.C. section 360bbb-3(b)(1), unless the authorization is terminated or revoked sooner.    Influenza A by PCR NEGATIVE NEGATIVE Final   Influenza B by PCR NEGATIVE NEGATIVE  Final    Comment: (NOTE) The Xpert Xpress SARS-CoV-2/FLU/RSV assay is intended as an aid in  the diagnosis of influenza from Nasopharyngeal swab specimens and  should not be used as a sole basis for treatment. Nasal washings and  aspirates are unacceptable for Xpert Xpress SARS-CoV-2/FLU/RSV  testing. Fact Sheet for Patients: https://www.moore.com/ Fact Sheet for Healthcare Providers: https://www.young.biz/ This test is not yet approved or cleared by the [place] FDA and  has been authorized for detection and/or diagnosis of SARS-CoV-2 by  FDA under an Emergency Use Authorization (EUA). This EUA will remain  in effect (meaning this test can be used) for the duration of the  Covid-19 declaration under Section 564(b)(1) of the Act, 21  U.S.C. section 360bbb-3(b)(1), unless the authorization is  terminated or revoked. Performed at [facility], [address] [place], [place] [address]     Radiology Reports CT Head Wo Contrast  Result Date: 01/23/2019 CLINICAL DATA:  Mental status change, alcohol/drug use. EXAM: CT HEAD WITHOUT CONTRAST TECHNIQUE: Contiguous axial images were obtained from the base of the skull through the vertex without intravenous contrast. COMPARISON:  Head CT 08/15/2018 FINDINGS: Brain: No evidence of acute intracranial hemorrhage. No demarcated cortical infarction. No evidence of intracranial mass. No midline shift or extra-axial fluid collection. Cerebral volume is normal for age. Vascular: No hyperdense vessel. Skull: Normal. Negative for fracture or focal lesion. Sinuses/Orbits: Chronic deformities of the bilateral lamina papyracea. Orbits otherwise unremarkable. Minimal mucosal thickening within the left frontal sinus and within bilateral ethmoid air cells. No significant mastoid effusion. Other: Unchanged small region of swelling/induration within the right frontal scalp (series 2, image 21). IMPRESSION: No evidence of acute  intracranial abnormality. Unchanged small region of swelling/induration within the right frontal scalp. Correlate with direct visualization. Electronically Signed   By: Jackey Loge DO   On: 01/23/2019 10:08   DG Chest Portable 1 View  Result Date: 01/23/2019 CLINICAL DATA:  Altered mental status EXAM: PORTABLE CHEST 1 VIEW COMPARISON:  July 16 2018 FINDINGS: There is slight atelectasis in the right base. The lungs elsewhere are clear. Heart size and pulmonary vascularity are normal. No adenopathy. No bone lesions. IMPRESSION: Slight right base atelectasis. Lungs elsewhere clear. Cardiac silhouette within normal limits. No evident adenopathy. Electronically Signed   By: Bretta Bang III M.D.   On: 01/23/2019 09:37     CBC Recent Labs  Lab 01/23/19 1001 01/23/19 1423 01/24/19 0648  WBC 7.4 9.5 7.9  HGB 14.3 13.6 13.1  HCT 42.7 41.5 39.1  PLT 296 265 237  MCV 107.8* 108.4* 109.2*  MCH 36.1* 35.5* 36.6*  MCHC 33.5 32.8 33.5  RDW 15.4 15.4 15.6*  LYMPHSABS 2.1  --   --   MONOABS 0.4  --   --   EOSABS 0.1  --   --   BASOSABS 0.0  --   --     Chemistries  Recent Labs  Lab 01/23/19 1001 01/23/19 1423 01/24/19 0648  NA 143 140 144  K 3.6 3.5 3.6  CL 111 112* 117*  CO2 20* 18* 19*  GLUCOSE 119* 106* 112*  BUN 5* 5* 5*  CREATININE 0.79 0.78 1.04  CALCIUM 9.2 8.6* 9.0  MG  --  1.9  --   AST 33 34 29  ALT 25 25 22   ALKPHOS 65 62 66  BILITOT 1.3* 1.2 2.0*   ------------------------------------------------------------------------------------------------------------------ No results for input(s): CHOL, HDL, LDLCALC, TRIG, CHOLHDL, LDLDIRECT in the last 72 hours.  No results found for: HGBA1C ------------------------------------------------------------------------------------------------------------------ No results for input(s): TSH, T4TOTAL, T3FREE, THYROIDAB in the last 72 hours.  Invalid input(s):  FREET3 ------------------------------------------------------------------------------------------------------------------ No results for input(s): VITAMINB12, FOLATE, FERRITIN, TIBC, IRON, RETICCTPCT in the last 72 hours.  Coagulation profile Recent Labs  Lab 01/24/19 0648  INR 1.2   No results for input(s): DDIMER in the last 72 hours.  Cardiac Enzymes No results for input(s): CKMB, TROPONINI, MYOGLOBIN in the last 168 hours.  Invalid input(s): CK ------------------------------------------------------------------------------------------------------------------ No results found for: BNP  01/26/19 M.D on 01/24/2019 at 2:51 PM  Go to [email or web address] - for contact info  [facility] - Office  [phone]

## 2019-01-25 LAB — AMMONIA: Ammonia: 43 umol/L — ABNORMAL HIGH (ref 9–35)

## 2019-01-25 MED ORDER — FOLIC ACID 1 MG PO TABS: 1.0000 mg | ORAL_TABLET | Freq: Every day | ORAL | 2 refills | Status: DC

## 2019-01-25 MED ORDER — TRAZODONE HCL 100 MG PO TABS: 100.0000 mg | ORAL_TABLET | Freq: Every day | ORAL | 5 refills | Status: DC

## 2019-01-25 MED ORDER — CITALOPRAM HYDROBROMIDE 20 MG PO TABS: 20.0000 mg | ORAL_TABLET | Freq: Every day | ORAL | 5 refills | Status: DC

## 2019-01-25 MED ORDER — THIAMINE HCL 100 MG PO TABS: 100.0000 mg | ORAL_TABLET | Freq: Every day | ORAL | 4 refills | Status: DC

## 2019-01-25 MED ORDER — HYDROXYZINE HCL 25 MG PO TABS: 25.0000 mg | ORAL_TABLET | Freq: Three times a day (TID) | ORAL | 0 refills | Status: DC | PRN

## 2019-01-25 MED ORDER — GABAPENTIN (ONCE-DAILY) 300 MG PO TABS: 1.0000 | ORAL_TABLET | Freq: Three times a day (TID) | ORAL | 1 refills | Status: DC

## 2019-01-25 NOTE — Progress Notes (Signed)
Discharge instructions reviewed with patient. Given AVS, prescriptions sent to [facility] pharmacy in [place] per MD. Patient verbalized understanding to follow-up with [facility] for f/u and pickup prescriptions. Provided substance abuse resources/treatment center information from CM as well. Patient's sister Joycelyn Man aware of discharge plan, states will pick him up. MD states he spoke with her regarding discharge plan as well. Patient in stable condition awaiting her arrival for discharge home. Earnstine Regal, RN

## 2019-01-25 NOTE — Discharge Summary (Signed)
John Grant, is a 54 y.o. male  DOB 14-Jul-1965  MRN [id number].  Admission date:  01/23/2019  Admitting Physician  Shon Hale, MD  Discharge Date:  01/25/2019   Primary MD  Patient, No Pcp Per  Recommendations for primary care physician for things to follow:   Abstinence from alcohol strongly advised -Take medications as prescribed -Follow-up with PCP for recheck and reevaluation within a week possibly through video/virtual visit   Admission Diagnosis  Hepatic encephalopathy (HCC) [K72.90] Encephalopathy [G93.40] Acute metabolic encephalopathy [G93.41] DTs (delirium tremens) (HCC) [F10.231]   Discharge Diagnosis  Hepatic encephalopathy (HCC) [K72.90] Encephalopathy [G93.40] Acute metabolic encephalopathy [G93.41] DTs (delirium tremens) (HCC) [F10.231]    Principal Problem:   DTs (delirium tremens) (HCC) Active Problems:   Acute hepatic encephalopathy   Acute metabolic encephalopathy   Cocaine abuse (HCC)   Polysubstance abuse (HCC)   Elevated BP without diagnosis of hypertension      Past Medical History:  Diagnosis Date  . Anxiety   . Depression     History reviewed. No pertinent surgical history.     HPI  from the history and physical done on the day of admission:   John Grant  is a 54 y.o. male past medical history relevant for polysubstance abuse including cocaine and alcohol abuse,, history of anxiety, depression presented to the ED with altered mentation/confusional episodes  -Patient was seen in the ED for similar presentation back in July 2020 and again in August 2020 both times with polysubstance abuse concerns  --History is limited now as patient is altered -UDS today is positive for cocaine and THC -COVID-19 negative -Blood alcohol level elevated at 64 -serum ammonia is 118 -CBC with a white count of 7.4 hemoglobin of 14.3 and platelet count of 296 MCV  is elevated at 107 with MCH of 36 consistent with his history of alcohol abuse -CMP without LFT elevation, except for bilirubin of 1.3, glucose is 119 anion gap is 12 with a creatinine of 0.79 potassium is 3.6 with a sodium of 143 -Serum acetaminophen and salicylate levels are nondetectable -Serum magnesium and phosphorus are not low -CT Head without acute findings and chest x-ray without acute findings      Hospital Course:    Brief Summary 53 y.o.malepast medical history relevant for polysubstance abuse including cocaine and alcohol abuse,,history ofanxiety, depression admitted on 01/23/19 with altered mentation/metabolic encephalopathy with concerns for acute hepatic encephalopathy and alcohol withdrawal/DTs  A/p  1)DTs-BAL was 64 on admission-delirium tremens pathophysiology appears to have resolved-Completed IV banana bag -Patient was treated with lorazepam per CIWA protocol, continue multivitamin including thiamine and folic acid -Abstinence from alcohol advised  2) acute hepatic encephalopathy-ammonia on admission was 118 patient had numerous loose stools after lactulose Ammonia level is down to 43 patient is alert and coherent -Suspect some degree of alcoholic liver disease -Abstinence from alcohol advised outpatient follow-up with GI advised  3) polysubstance abuse--UDS positive for cocaine and THC, blood alcohol level 64, Treated with lorazepam as above #1, methocarbamol, gabapentin, trazodone  and hydroxyzine as ordered to help blunt withdrawal symptoms --- Delirium tremens and overall withdrawal symptoms appears to have resolved -Patient insisting on being discharged home, discussed with patient's sister, -Patient threatening to leave AMA he is now coherent alert and oriented x3 -We will discharge home with family  Disposition--- Home with familyCode Status : Full  Family Communication:    Discussed with patient's sister Ms. Maryruth Eve--- questions  answered  Consults  :  Na   Discharge Condition: Stable  Follow UP--PCP and GI physician  Diet and Activity recommendation:  As advised  Discharge Instructions    Discharge Instructions    Call MD for:  difficulty breathing, headache or visual disturbances   Complete by: As directed    Call MD for:  extreme fatigue   Complete by: As directed    Call MD for:  persistant dizziness or light-headedness   Complete by: As directed    Call MD for:  persistant nausea and vomiting   Complete by: As directed    Call MD for:  severe uncontrolled pain   Complete by: As directed    Call MD for:  temperature >100.4   Complete by: As directed    Diet - low sodium heart healthy   Complete by: As directed    Discharge instructions   Complete by: As directed    Abstinence from alcohol strongly advised -Take medications as prescribed -Follow-up with PCP for recheck and reevaluation within a week possibly through video/virtual visit   Increase activity slowly   Complete by: As directed         Discharge Medications     Allergies as of 01/25/2019   No Known Allergies     Medication List    TAKE these medications   citalopram 20 MG tablet Commonly known as: CELEXA Take 1 tablet (20 mg total) by mouth daily.   folic acid 1 MG tablet Commonly known as: FOLVITE Take 1 tablet (1 mg total) by mouth daily. Start taking on: January 26, 2019   Gabapentin (Once-Daily) 300 MG Tabs Take 1 tablet by mouth 3 (three) times daily.   hydrOXYzine 25 MG tablet Commonly known as: ATARAX/VISTARIL Take 1 tablet (25 mg total) by mouth every 8 (eight) hours as needed for anxiety, itching or nausea.   multivitamin with minerals Tabs tablet Take 1 tablet by mouth daily.   thiamine 100 MG tablet Take 1 tablet (100 mg total) by mouth daily. Start taking on: January 26, 2019   traZODone 100 MG tablet Commonly known as: DESYREL Take 1 tablet (100 mg total) by mouth at bedtime.       Major  procedures and Radiology Reports - PLEASE review detailed and final reports for all details, in brief -    CT Head Wo Contrast  Result Date: 01/23/2019 CLINICAL DATA:  Mental status change, alcohol/drug use. EXAM: CT HEAD WITHOUT CONTRAST TECHNIQUE: Contiguous axial images were obtained from the base of the skull through the vertex without intravenous contrast. COMPARISON:  Head CT 08/15/2018 FINDINGS: Brain: No evidence of acute intracranial hemorrhage. No demarcated cortical infarction. No evidence of intracranial mass. No midline shift or extra-axial fluid collection. Cerebral volume is normal for age. Vascular: No hyperdense vessel. Skull: Normal. Negative for fracture or focal lesion. Sinuses/Orbits: Chronic deformities of the bilateral lamina papyracea. Orbits otherwise unremarkable. Minimal mucosal thickening within the left frontal sinus and within bilateral ethmoid air cells. No significant mastoid effusion. Other: Unchanged small region of swelling/induration within the right frontal  scalp (series 2, image 21). IMPRESSION: No evidence of acute intracranial abnormality. Unchanged small region of swelling/induration within the right frontal scalp. Correlate with direct visualization. Electronically Signed   By: Kellie Simmering DO   On: 01/23/2019 10:08   DG Chest Portable 1 View  Result Date: 01/23/2019 CLINICAL DATA:  Altered mental status EXAM: PORTABLE CHEST 1 VIEW COMPARISON:  July 16 2018 FINDINGS: There is slight atelectasis in the right base. The lungs elsewhere are clear. Heart size and pulmonary vascularity are normal. No adenopathy. No bone lesions. IMPRESSION: Slight right base atelectasis. Lungs elsewhere clear. Cardiac silhouette within normal limits. No evident adenopathy. Electronically Signed   By: Lowella Grip III M.D.   On: 01/23/2019 09:37    Micro Results    Recent Results (from the past 240 hour(s))  Respiratory Panel by RT PCR (Flu A&B, Covid) - Nasopharyngeal Swab      Status: None   Collection Time: 01/23/19 10:21 AM   Specimen: Nasopharyngeal Swab  Result Value Ref Range Status   SARS Coronavirus 2 by RT PCR NEGATIVE NEGATIVE Final    Comment: (NOTE) SARS-CoV-2 target nucleic acids are NOT DETECTED. The SARS-CoV-2 RNA is generally detectable in upper respiratoy specimens during the acute phase of infection. The lowest concentration of SARS-CoV-2 viral copies this assay can detect is 131 copies/mL. A negative result does not preclude SARS-Cov-2 infection and should not be used as the sole basis for treatment or other patient management decisions. A negative result may occur with  improper specimen collection/handling, submission of specimen other than nasopharyngeal swab, presence of viral mutation(s) within the areas targeted by this assay, and inadequate number of viral copies (<131 copies/mL). A negative result must be combined with clinical observations, patient history, and epidemiological information. The expected result is Negative. Fact Sheet for Patients:  PinkCheek.be Fact Sheet for Healthcare Providers:  GravelBags.it This test is not yet ap proved or cleared by the [place] FDA and  has been authorized for detection and/or diagnosis of SARS-CoV-2 by FDA under an Emergency Use Authorization (EUA). This EUA will remain  in effect (meaning this test can be used) for the duration of the COVID-19 declaration under Section 564(b)(1) of the Act, 21 U.S.C. section 360bbb-3(b)(1), unless the authorization is terminated or revoked sooner.    Influenza A by PCR NEGATIVE NEGATIVE Final   Influenza B by PCR NEGATIVE NEGATIVE Final    Comment: (NOTE) The Xpert Xpress SARS-CoV-2/FLU/RSV assay is intended as an aid in  the diagnosis of influenza from Nasopharyngeal swab specimens and  should not be used as a sole basis for treatment. Nasal washings and  aspirates are unacceptable for  Xpert Xpress SARS-CoV-2/FLU/RSV  testing. Fact Sheet for Patients: PinkCheek.be Fact Sheet for Healthcare Providers: GravelBags.it This test is not yet approved or cleared by the [place] FDA and  has been authorized for detection and/or diagnosis of SARS-CoV-2 by  FDA under an Emergency Use Authorization (EUA). This EUA will remain  in effect (meaning this test can be used) for the duration of the  Covid-19 declaration under Section 564(b)(1) of the Act, 21  U.S.C. section 360bbb-3(b)(1), unless the authorization is  terminated or revoked. Performed at [facility], [address] [place], [place] [address]        Today   Yates today has no New complaints   -Delirium tremens and overall withdrawal symptoms appears to have resolved -Patient insisting on being discharged home, discussed with patient's sister, -  Patient threatening to leave AMA he is now coherent alert and oriented x3 -We will discharge home with family        Patient has been seen and examined prior to discharge   Objective   Blood pressure (!) 148/89, pulse 71, temperature 97.9 F (36.6 C), temperature source Oral, resp. rate 18, height 5\' 6"  (1.676 m), weight 73 kg, SpO2 99 %.   Intake/Output Summary (Last 24 hours) at 01/25/2019 1251 Last data filed at 01/25/2019 0900 Gross per 24 hour  Intake 600 ml  Output 350 ml  Net 250 ml    Exam Gen:- Awake Alert, no acute distress  HEENT:- [place].AT, No sclera icterus Neck-Supple Neck,No JVD,.  Lungs-  CTAB , good air movement bilaterally  CV- S1, S2 normal, regular Abd-  +ve B.Sounds, Abd Soft, No tenderness,    Extremity/Skin:- No  edema,   good pulses Psych-affect is appropriate, oriented x3 Neuro-no new focal deficits, no tremors    Data Review   CBC w Diff:  Lab Results  Component Value Date   WBC 7.9 01/24/2019   HGB 13.1 01/24/2019   HCT 39.1 01/24/2019   PLT  237 01/24/2019   LYMPHOPCT 28 01/23/2019   MONOPCT 6 01/23/2019   EOSPCT 1 01/23/2019   BASOPCT 1 01/23/2019    CMP:  Lab Results  Component Value Date   NA 144 01/24/2019   K 3.6 01/24/2019   CL 117 (H) 01/24/2019   CO2 19 (L) 01/24/2019   BUN 5 (L) 01/24/2019   CREATININE 1.04 01/24/2019   PROT 7.4 01/24/2019   ALBUMIN 3.9 01/24/2019   BILITOT 2.0 (H) 01/24/2019   ALKPHOS 66 01/24/2019   AST 29 01/24/2019   ALT 22 01/24/2019  .   Total Discharge time is about 33 minutes  01/26/2019 M.D on 01/25/2019 at 12:51 PM  Go to [email or web address] -  for contact info  [facility] - Office  [phone]

## 2019-01-25 NOTE — Discharge Instructions (Signed)
Abstinence from alcohol strongly advised -Take medications as prescribed -Follow-up with PCP for recheck and reevaluation within a week possibly through video/virtual visit

## 2019-12-04 ENCOUNTER — Other Ambulatory Visit: Payer: Self-pay

## 2019-12-04 ENCOUNTER — Emergency Department (HOSPITAL_COMMUNITY)
Admission: EM | Admit: 2019-12-04 | Discharge: 2019-12-05 | Disposition: A | Discharge status: Home / Self Care | Payer: Self-pay | Attending: Emergency Medicine | Admitting: Emergency Medicine

## 2019-12-04 ENCOUNTER — Emergency Department (HOSPITAL_COMMUNITY): Payer: Self-pay

## 2019-12-04 ENCOUNTER — Encounter (HOSPITAL_COMMUNITY): Payer: Self-pay | Admitting: Emergency Medicine

## 2019-12-04 DIAGNOSIS — R7989 Other specified abnormal findings of blood chemistry: Secondary | ICD-10-CM

## 2019-12-04 DIAGNOSIS — F14129 Cocaine abuse with intoxication, unspecified: Secondary | ICD-10-CM | POA: Insufficient documentation

## 2019-12-04 DIAGNOSIS — F10129 Alcohol abuse with intoxication, unspecified: Secondary | ICD-10-CM | POA: Insufficient documentation

## 2019-12-04 DIAGNOSIS — F19129 Other psychoactive substance abuse with intoxication, unspecified: Secondary | ICD-10-CM | POA: Insufficient documentation

## 2019-12-04 DIAGNOSIS — F17210 Nicotine dependence, cigarettes, uncomplicated: Secondary | ICD-10-CM | POA: Insufficient documentation

## 2019-12-04 DIAGNOSIS — Z79899 Other long term (current) drug therapy: Secondary | ICD-10-CM | POA: Insufficient documentation

## 2019-12-04 DIAGNOSIS — R531 Weakness: Secondary | ICD-10-CM

## 2019-12-04 DIAGNOSIS — E7220 Disorder of urea cycle metabolism, unspecified: Secondary | ICD-10-CM | POA: Insufficient documentation

## 2019-12-04 DIAGNOSIS — F19929 Other psychoactive substance use, unspecified with intoxication, unspecified: Secondary | ICD-10-CM

## 2019-12-04 DIAGNOSIS — I10 Essential (primary) hypertension: Secondary | ICD-10-CM | POA: Insufficient documentation

## 2019-12-04 HISTORY — DX: Other psychoactive substance abuse, uncomplicated: F19.10

## 2019-12-04 HISTORY — DX: Metabolic encephalopathy: G93.41

## 2019-12-04 LAB — COMPREHENSIVE METABOLIC PANEL
ALT: 32 U/L (ref 0–44)
AST: 38 U/L (ref 15–41)
Albumin: 4.8 g/dL (ref 3.5–5.0)
Alkaline Phosphatase: 67 U/L (ref 38–126)
Anion gap: 12 (ref 5–15)
BUN: 7 mg/dL (ref 6–20)
CO2: 18 mmol/L — ABNORMAL LOW (ref 22–32)
Calcium: 9.4 mg/dL (ref 8.9–10.3)
Chloride: 112 mmol/L — ABNORMAL HIGH (ref 98–111)
Creatinine, Ser: 0.99 mg/dL (ref 0.61–1.24)
GFR, Estimated: >60 mL/min (ref >60)
Glucose, Bld: 138 mg/dL — ABNORMAL HIGH (ref 70–99)
Potassium: 4 mmol/L (ref 3.5–5.1)
Sodium: 142 mmol/L (ref 135–145)
Total Bilirubin: 1.1 mg/dL (ref 0.3–1.2)
Total Protein: 8.4 g/dL — ABNORMAL HIGH (ref 6.5–8.1)

## 2019-12-04 LAB — APTT: aPTT: 28 seconds (ref 24–36)

## 2019-12-04 LAB — CBC
HCT: 39.6 % (ref 39.0–52.0)
Hemoglobin: 13.7 g/dL (ref 13.0–17.0)
MCH: 35.1 pg — ABNORMAL HIGH (ref 26.0–34.0)
MCHC: 34.6 g/dL (ref 30.0–36.0)
MCV: 101.5 fL — ABNORMAL HIGH (ref 80.0–100.0)
Platelets: 285 10*3/uL (ref 150–400)
RBC: 3.9 MIL/uL — ABNORMAL LOW (ref 4.22–5.81)
RDW: 14 % (ref 11.5–15.5)
WBC: 10.8 10*3/uL — ABNORMAL HIGH (ref 4.0–10.5)
nRBC: 0 % (ref 0.0–0.2)

## 2019-12-04 LAB — DIFFERENTIAL
Abs Immature Granulocytes: 0.04 10*3/uL (ref 0.00–0.07)
Basophils Absolute: 0.1 10*3/uL (ref 0.0–0.1)
Basophils Relative: 1 %
Eosinophils Absolute: 0.2 10*3/uL (ref 0.0–0.5)
Eosinophils Relative: 2 %
Immature Granulocytes: 0 %
Lymphocytes Relative: 34 %
Lymphs Abs: 3.6 10*3/uL (ref 0.7–4.0)
Monocytes Absolute: 0.8 10*3/uL (ref 0.1–1.0)
Monocytes Relative: 7 %
Neutro Abs: 6.2 10*3/uL (ref 1.7–7.7)
Neutrophils Relative %: 56 %

## 2019-12-04 LAB — I-STAT CHEM 8, ED
BUN: 5 mg/dL — ABNORMAL LOW (ref 6–20)
Calcium, Ion: 1.16 mmol/L (ref 1.15–1.40)
Chloride: 114 mmol/L — ABNORMAL HIGH (ref 98–111)
Creatinine, Ser: 1 mg/dL (ref 0.61–1.24)
Glucose, Bld: 141 mg/dL — ABNORMAL HIGH (ref 70–99)
HCT: 42 % (ref 39.0–52.0)
Hemoglobin: 14.3 g/dL (ref 13.0–17.0)
Potassium: 3.6 mmol/L (ref 3.5–5.1)
Sodium: 148 mmol/L — ABNORMAL HIGH (ref 135–145)
TCO2: 19 mmol/L — ABNORMAL LOW (ref 22–32)

## 2019-12-04 LAB — AMMONIA: Ammonia: 84 umol/L — ABNORMAL HIGH (ref 9–35)

## 2019-12-04 LAB — CBG MONITORING, ED: Glucose-Capillary: 133 mg/dL — ABNORMAL HIGH (ref 70–99)

## 2019-12-04 LAB — PROTIME-INR
INR: 1.1 (ref 0.8–1.2)
Prothrombin Time: 13.6 seconds (ref 11.4–15.2)

## 2019-12-04 LAB — ETHANOL: Alcohol, Ethyl (B): 16 mg/dL — ABNORMAL HIGH (ref <10)

## 2019-12-04 MED ORDER — LACTULOSE 10 G PO PACK: 10.0000 g | PACK | Freq: Two times a day (BID) | ORAL | 0 refills | Status: AC

## 2019-12-04 NOTE — ED Provider Notes (Signed)
[facility] EMERGENCY DEPARTMENT Provider Note   CSN: [id number] Arrival date & time: 12/04/19  4403  An emergency department physician performed an initial assessment on this suspected stroke patient at 54.  History CC;  AMS  Ishaaq Penna is a 54 y.o. male with a history of acute metabolic encephalopathy, polysubstance abuse, cocaine use, alcohol use disorder, hypertension, presented emergency department altered mental status.  EMS reports that the patient was noted to be in his usual state of health this morning around 11 AM and 12 PM.  He was subsequently found by family members at approximately 730, roaming the apartment but not responding to them.  They said this was an acute behavioral change.  EMS was called to scene.  There was a patient had some minor hypertension blood pressure 160/110, and with speaking nonsensically.Marland Kitchen  He was ambulatory on scene.  No meds given by EMS.  On arrival the patient is altered and cannot provide any history.  Per medical record review, it appears that he has had several hospitalizations or ED visits with altered mental status.  In the past has been diagnosed with acute encephalopathy, often felt to be secondary to cocaine and other recent drug use.  Most recently was hospitalized in August 2020, had an EEG at that time but no acute findings.  CTH in the past negative. After 12-24 hour period of observation in the hospital, he returned to his baseline mental status.  HPI     Past Medical History:  Diagnosis Date  . Anxiety   . Depression   . Metabolic encephalopathy   . Polysubstance abuse [facility])     Patient Active Problem List   Diagnosis Date Noted  . DTs (delirium tremens) (HCC) 01/24/2019  . Acute hepatic encephalopathy 01/24/2019  . Elevated BP without diagnosis of hypertension 01/24/2019  . Polysubstance abuse (HCC) 01/23/2019  . Acute metabolic encephalopathy 08/15/2018  . Cocaine abuse (HCC) 08/15/2018    History reviewed. No  pertinent surgical history.     History reviewed. No pertinent family history.  Social History   Tobacco Use  . Smoking status: Heavy Tobacco Smoker    Packs/day: 1.00    Types: Cigarettes  . Smokeless tobacco: Never Used  Vaping Use  . Vaping Use: Never used  Substance Use Topics  . Alcohol use: Yes    Comment: daily  . Drug use: Yes    Types: Marijuana    Comment: unknown    Home Medications Prior to Admission medications   Medication Sig Start Date End Date Taking? Authorizing Provider  citalopram (CELEXA) 20 MG tablet Take 1 tablet (20 mg total) by mouth daily. 01/25/19   Shon Hale, MD  folic acid (FOLVITE) 1 MG tablet Take 1 tablet (1 mg total) by mouth daily. 01/26/19   Shon Hale, MD  Gabapentin, Once-Daily, 300 MG TABS Take 1 tablet by mouth 3 (three) times daily. 01/25/19   Shon Hale, MD  hydrOXYzine (ATARAX/VISTARIL) 25 MG tablet Take 1 tablet (25 mg total) by mouth every 8 (eight) hours as needed for anxiety, itching or nausea. 01/25/19   Shon Hale, MD  lactulose (CEPHULAC) 10 g packet Take 1 packet (10 g total) by mouth 2 (two) times daily for 2 doses. 12/04/19 12/05/19  Terald Sleeper, MD  Multiple Vitamin (MULTIVITAMIN WITH MINERALS) TABS tablet Take 1 tablet by mouth daily. Patient not taking: Reported on 01/23/2019 08/17/18   Shon Hale, MD  thiamine 100 MG tablet Take 1 tablet (100 mg total) by mouth  daily. 01/26/19   Shon Hale, MD  traZODone (DESYREL) 100 MG tablet Take 1 tablet (100 mg total) by mouth at bedtime. 01/25/19   Shon Hale, MD    Allergies    Patient has no known allergies.  Review of Systems   Review of Systems  Unable to perform ROS: Mental status change (level 5 caveat)    Physical Exam Updated Vital Signs BP 132/71 Comment: Simultaneous filing. User may not have seen previous data.  Pulse 73 Comment: Simultaneous filing. User may not have seen previous data.  Temp 97.6 F (36.4 C) (Axillary)    Resp 15 Comment: Simultaneous filing. User may not have seen previous data.  Ht 5\' 6"  (1.676 m)   Wt 76 kg   SpO2 100% Comment: Simultaneous filing. User may not have seen previous data.  BMI 27.05 kg/m   Physical Exam Vitals and nursing note reviewed.  Constitutional:      Appearance: He is well-developed.     Comments: Not following commands  HENT:     Head: Normocephalic and atraumatic.  Eyes:     Conjunctiva/sclera: Conjunctivae normal.  Cardiovascular:     Rate and Rhythm: Normal rate and regular rhythm.     Pulses: Normal pulses.  Pulmonary:     Effort: Pulmonary effort is normal. No respiratory distress.     Breath sounds: Normal breath sounds.  Abdominal:     General: There is no distension.     Palpations: Abdomen is soft.     Tenderness: There is no abdominal tenderness.  Musculoskeletal:     Cervical back: Neck supple.  Skin:    General: Skin is warm and dry.  Neurological:     Mental Status: He is alert.     Comments: Uncooperative with exam Some unilateral weakness in left arm, patient not cooperative with exam and not providing much effort on strength testing, can lift against gravity, questionable left sided pronator drift Will not perform lateral eye gaze in either direction, no gaze deviation Incoherent verbal response, no slurred speech No evident facial droop GCS Eyes 4, Verbal 3, Motor 5     ED Results / Procedures / Treatments   Labs (all labs ordered are listed, but only abnormal results are displayed) Labs Reviewed  ETHANOL - Abnormal; Notable for the following components:      Result Value   Alcohol, Ethyl (B) 16 (*)    All other components within normal limits  CBC - Abnormal; Notable for the following components:   WBC 10.8 (*)    RBC 3.90 (*)    MCV 101.5 (*)    MCH 35.1 (*)    All other components within normal limits  COMPREHENSIVE METABOLIC PANEL - Abnormal; Notable for the following components:   Chloride 112 (*)    CO2 18 (*)     Glucose, Bld 138 (*)    Total Protein 8.4 (*)    All other components within normal limits  AMMONIA - Abnormal; Notable for the following components:   Ammonia 84 (*)    All other components within normal limits  I-STAT CHEM 8, ED - Abnormal; Notable for the following components:   Sodium 148 (*)    Chloride 114 (*)    BUN 5 (*)    Glucose, Bld 141 (*)    TCO2 19 (*)    All other components within normal limits  CBG MONITORING, ED - Abnormal; Notable for the following components:   Glucose-Capillary 133 (*)  All other components within normal limits  PROTIME-INR  APTT  DIFFERENTIAL  RAPID URINE DRUG SCREEN, HOSP PERFORMED  URINALYSIS, ROUTINE W REFLEX MICROSCOPIC    EKG EKG Interpretation  Date/Time:  Thursday December 04 2019 19:48:09 EST Ventricular Rate:  98 PR Interval:    QRS Duration: 90 QT Interval:  394 QTC Calculation: 504 R Axis:   66 Text Interpretation: Sinus rhythm Prolonged QT interval No STEMI Confirmed by Alvester Chou [phone]) on 12/04/2019 10:02:11 PM   Radiology CT HEAD CODE STROKE WO CONTRAST  Result Date: 12/04/2019 CLINICAL DATA:  Code stroke. Neuro deficit, acute, stroke suspected. Abnormal speech. EXAM: CT HEAD WITHOUT CONTRAST TECHNIQUE: Contiguous axial images were obtained from the base of the skull through the vertex without intravenous contrast. COMPARISON:  CT head without contrast 01/23/2019 FINDINGS: Brain: No acute infarct, hemorrhage, or mass lesion is present. No significant white matter lesions are present. Basal ganglia are within normal limits. Insular ribbon is normal. Vascular: No hyperdense vessel or unexpected calcification. Skull: Calvarium is intact. No focal lytic or blastic lesions are present. No significant extracranial soft tissue lesion is present. Sinuses/Orbits: The paranasal sinuses and mastoid air cells are clear. Marked lucency present about the roots of the first premolar tooth in the right maxilla. No adjacent soft  tissue abnormality is present. Remote orbital blowout fractures are seen bilaterally. Globes and orbits are otherwise within normal. ASPECTS [facility] Stroke Program Early CT Score) - Ganglionic level infarction (caudate, lentiform nuclei, internal capsule, insula, M1-M3 cortex): 7/7 - Supraganglionic infarction (M4-M6 cortex): 3/3 Total score (0-10 with 10 being normal): 10/10 IMPRESSION: 1. Normal CT appearance of the brain. 2. ASPECTS is 10/10. 3. Marked lucency about the roots of the first premolar tooth in the right maxilla. Question dental infection. These results were called by telephone at the time of interpretation on 12/04/2019 at 8:05 pm to provider Alison Breeding , who verbally acknowledged these results. Electronically Signed   By: Marin Roberts M.D.   On: 12/04/2019 20:06    Procedures Procedures (including critical care time)  Medications Ordered in ED Medications - No data to display  ED Course  I have reviewed the triage vital signs and the nursing notes.  Pertinent labs & imaging results that were available during my care of the patient were reviewed by me and considered in my medical decision making (see chart for details).  This patient presents with concerns for AMS.  This involves an extensive number of treatment options, and is a complaint that carries with it a high risk of complications and morbidity.  The differential diagnosis includes stroke vs ICH vs polysubstance abuse vs metabolic encephalopathy vs other  On arrival, given my inability to test for gaze deviation or perform adequate strength and neglect testing, I made the patient a stroke alert.  LKW was 8 hours prior.  He was taken for [facility], which had no acute findings.   On his return he was evaluated by teleneurology, and at that time was actively moving all of his extremities.  Per their consultation, this was felt to be more likely a metabolic encephalopathy, less likely CVA or LVO.  Subsequently over the  next 2 hours the patient demonstrated a rapid improvement of his mental status.  On reassessment I was able to perform a full, normal neurological exam.    At this time, based on his medical record review, I strongly suspected this presentation was similar to his prior episodes this year.  Likely a combined metabolic and  polysubstance related encephalopathy, with some ammonia elevation related to chronic drinking.  His sister by phone (who lives with him) tells me he drinks etoh daily.  The patient denies ever drinking or using drugs, and unfortunately is not a reliable historian.  I ordered, reviewed, and interpreted labs, which included CMP (near baseline), etoh level 16, ammonia 84, INR 1.1, PT 13.6, CBC with MCV 101.5 consistent with chronic etoh consumption. ECG on arrival shows sinus rhythm with no acute ischemic findings per my interpretation CTH with no acute infarct Monitor tracing with sinus rhythm per my review Additional history was obtained from his sister by phone Previous records obtained and reviewed showing prior ED visits and hospitalizations for similar presenation  Reassessments as below:  Clinical Course as of Dec 04 98  Thu Dec 04, 2019  2006 [facility]  Teleneuro setting up at bedside now.   [MT]  2143 Vitals stable, patient sleeping.  Per neurology consultation, less likely CVA at this time.  Advised medical encephalopathy w/u.     [MT]  2232 Significantly improved mental status, now awake and conversant.  Patient continues to deny knowing why he was confused.  When I asked if he was drinking, he says no.  When I point out his etoh is positive, he says "Oh I was getting ready to drink."  I asked repeatedly if he used any other drugs, as he has on past hospitalizations, and he says no.  We'll continue to monitor for improving sobriety - anticipate discharge home with 1 day of lactulose for his ammonia level and advise to cease etoh again.  I  also spoke to his sister about this.   [MT]  2337 Attempted to ambulate, still a little wobbly on his feet, we'll reassess again.  Signed out to overnight edp team with plan to discharge home (sister awaiting him) once sober enough - lactulose script provided and discharge instructions stipulated.     [MT]  2338 Suspect this is again polypharmacy, not simply etoh, and possibly some ammonia contribution, although I do not think he needs hospitalization at this level with his rapidly improving mental status.   [MT]    Clinical Course User Index [MT] Terald Sleeperrifan, Taggart Prasad J, MD    Final Clinical Impression(s) / ED Diagnoses Final diagnoses:  Weakness  Increased ammonia level  Drug intoxication with complication (HCC)    Rx / DC Orders ED Discharge Orders         Ordered    lactulose (CEPHULAC) 10 g packet  2 times daily       Note to Pharmacy: Patient only requires 2 doses for a single day use   12/04/19 2300           Terald Sleeperrifan, Ajwa Kimberley J, MD 12/05/19 0100

## 2019-12-04 NOTE — Progress Notes (Signed)
CODE STROKE DOCUMENTATION  CALL TIME = 19:30 EXAM STARTED - 1951 EXAM FINISHED = 1934 IMAGES SENT TO SOC = 1955 EXAM COMPLETED IN Epic = 1956 [facility] CALLED [phone]

## 2019-12-04 NOTE — ED Notes (Signed)
Restraints untied for trial period due to patient sleeping. Will reassess once patient is alert.

## 2019-12-04 NOTE — Discharge Instructions (Addendum)
You are brought into the emergency department by ambulance today because you were behaving strangely at home and appeared very confused.  Your work-up in the emergency department did not show any signs of stroke or heart attack.  You have had several visits to the ER in the past year that were similar to today.  In the past, you have been advised very strongly to cut back on your alcohol and drug use.  Today there was alcohol again in your blood work.  I would advise that you cut back your drinking, cutting down by 1 drink per day, rather than stopping cold [place] at once.    Your ammonia level was high in your blood.  This can cause some confusion.  It is usually caused by liver problems from drinking.  I prescribed a medicine called lactulose that will make you POOP out this toxin.  Take it twice tomorrow (in the morning and at night, with water). You will have diarrhea for 1-2 days, so drink lots of water.  The diarrhea should improve then.  You should ONLY take this medicine for 1 day.  If the pharmacy gives you extra doses, save them at home.

## 2019-12-04 NOTE — ED Notes (Signed)
Restraints reapplied after patient began to wake up due to patient attempting to get out of bed and pulling at lines.

## 2019-12-04 NOTE — ED Notes (Signed)
Stroke swallow screen attempted, pt unable to stay awake.

## 2019-12-04 NOTE — Consult Note (Signed)
[facility] Tele[facility]rology Consult Services   Date of Service:   12/04/2019 19:54:17  Impression:     .  G93.49 - Encephalopathy Multifactorial  Comments/Sign-Out: 54 year old male who presents with altered mental status. Presentation is similar to previous admissions and likely secondary to underlying metabolic or toxic encephalopathy.  Metrics: Last Known Well: Unknown [facility] Notification Time: 12/04/2019 19:54:17 Arrival Time: 12/04/2019 19:38:00 Stamp Time: 12/04/2019 19:54:17 Time First Login Attempt: 12/04/2019 19:59:36 Symptoms: Encephalopathy. NIHSS Start Assessment Time: 12/04/2019 20:02:10 Patient is not a candidate for Thrombolytic. Thrombolytic Medical Decision: 12/04/2019 20:05:00 Patient was not deemed candidate for Thrombolytic because of following reasons: Last Well Known Above 4.5 Hours.  CT head showed no acute hemorrhage or acute core infarct.  ED Physician notified of diagnostic impression and management plan on 12/04/2019 20:27:15  Advanced Imaging: Advanced Imaging Not Recommended because:  Presentation not consistent with LVO   Our recommendations are outlined below.  Recommendations:     .  Activate Stroke Protocol Admission/Order Set     .  Stroke/Telemetry Floor     .  Neuro Checks     .  Bedside Swallow Eval     .  DVT Prophylaxis     .  IV Fluids, Normal Saline     .  Head of Bed 30 Degrees     .  Euglycemia and Avoid Hyperthermia (PRN Acetaminophen)  Routine Consultation with Inhouse Neurology for Follow up Care  Sign Out:     .  Discussed with Emergency Department Provider    ------------------------------------------------------------------------------  History of Present Illness: Patient is a 54 year old Male.  Patient was brought by EMS for symptoms of Encephalopathy.  54 year old male who presents to the hospital because of altered mental status. Per EMS patient was last seen normal about 8  hours ago. It is unclear when his mental status changed. On arrival in the ED patient was not follwoing any commands and initially was not moving his left arm however later in the exam he was moving all extremities but still would not follow commands or speak. Per chart review patient has had similar presentations in the past secondary to a metabolic encephalopathy or DTs.    Social History: Alcohol Use: Yes  Family History:Unable to obtain due to Patient Status  Review of System:  14 Points Review of Systems was performed and was negative except mentioned in HPI.  Anticoagulant use:  Unknown  Antiplatelet use: Unknown  Allergies:  Unable to Obtain     Examination: BP(178/108), Pulse(112), Blood Glucose(133) 1A: Level of Consciousness - Arouses to minor stimulation + 1 1B: Ask Month and Age - Could Not Answer Either Question Correctly + 2 1C: Blink Eyes & Squeeze Hands - Performs 0 Tasks + 2 2: Test Horizontal Extraocular Movements - Normal + 0 3: Test Visual Fields - No Visual Loss + 0 4: Test Facial Palsy (Use Grimace if Obtunded) - Normal symmetry + 0 5A: Test Left Arm Motor Drift - Some Effort Against Gravity + 2 5B: Test Right Arm Motor Drift - Some Effort Against Gravity + 2 6A: Test Left Leg Motor Drift - Some Effort Against Gravity + 2 6B: Test Right Leg Motor Drift - Some Effort Against Gravity + 2 7: Test Limb Ataxia (FNF/Heel-Shin) - No Ataxia + 0 8: Test Sensation - Normal; No sensory loss + 0 9: Test Language/Aphasia - Mute/Global Aphasia: No Usable Speech/Auditory Comprehension + 3 10: Test Dysarthria - Mute/Anarthric + 2 11:  Test Extinction/Inattention - No abnormality + 0  NIHSS Score: 18  Pre-Morbid Modified Rankin Scale: Unable to assess   Patient/Family was informed the Neurology Consult would occur via TeleHealth consult by way of interactive audio and video telecommunications and consented to receiving care in this manner.   Patient is being  evaluated for possible acute neurologic impairment and high probability of imminent or life-threatening deterioration. I spent total of 30 minutes providing care to this patient, including time for face to face visit via telemedicine, review of medical records, imaging studies and discussion of findings with providers, the patient and/or family.   Dr Joice Lofts   [facility] [phone]  Case [id number]

## 2019-12-04 NOTE — ED Notes (Signed)
Patient transported to CT 

## 2019-12-04 NOTE — ED Triage Notes (Signed)
Pt brought in by [facility] after family stated that pt was getting progressively more altered throughout day. Per family, pt last known well 8hrs PTA. EMS states pt unable to follow commands and "keeps repeating self".

## 2019-12-04 NOTE — ED Notes (Signed)
Pt tolerated PO challenge

## 2019-12-05 NOTE — ED Notes (Signed)
Pt ambulated in room with a steady gait and no assistance.

## 2019-12-05 NOTE — ED Provider Notes (Signed)
Nurses have gotten patient up and he is able to ambulate without assistance.  He was discharged home.   Devoria Albe, MD 12/05/19 [phone]

## 2020-02-10 ENCOUNTER — Other Ambulatory Visit: Payer: Self-pay

## 2021-01-17 IMAGING — CT CT HEAD CODE STROKE
3 of 4 series · 15 of 47 positions shown, 18 images · non-contrast
Comparison: CT head without contrast 01/23/2019

CLINICAL DATA: Code stroke. Neuro deficit, acute, stroke suspected.
Abnormal speech.

EXAM:
CT HEAD WITHOUT CONTRAST
TECHNIQUE: Contiguous axial images were obtained from the base of the skull
through the vertex without intravenous contrast.

[Series 3: head w o · axial · 0.47mm/px · z∈[-14,+156]mm · 9 of 40 slices shown, 12 images]
[im 3/40  brain]
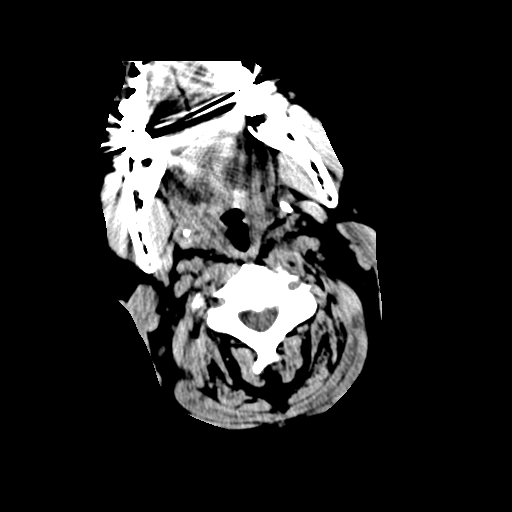
[im 3/40  bone]
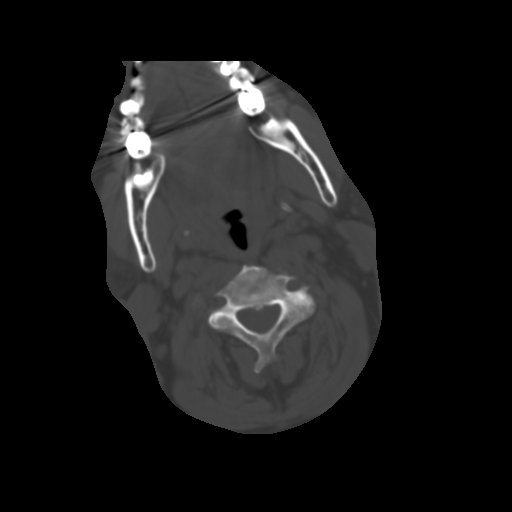
[im 9/40  brain]
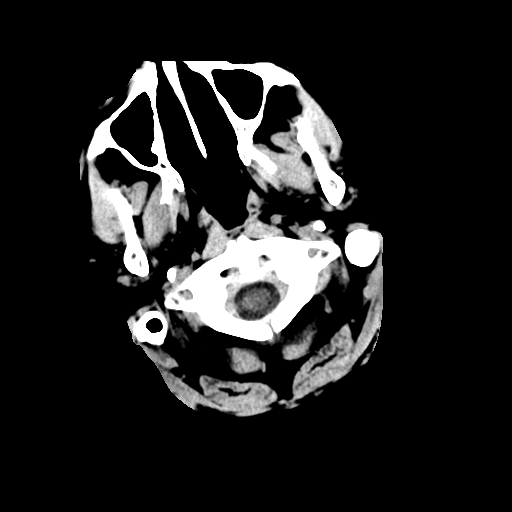
[im 12/40  brain]
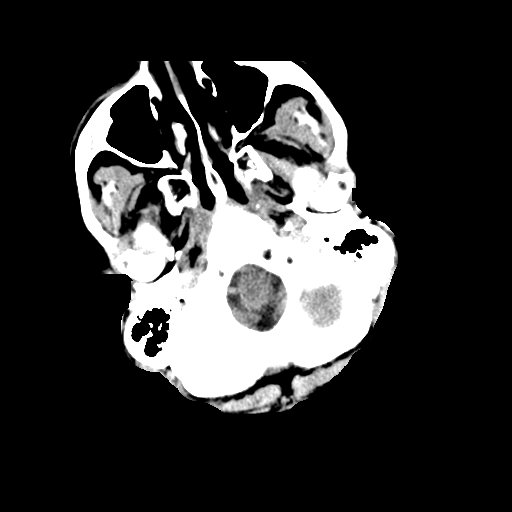
[im 17/40  brain]
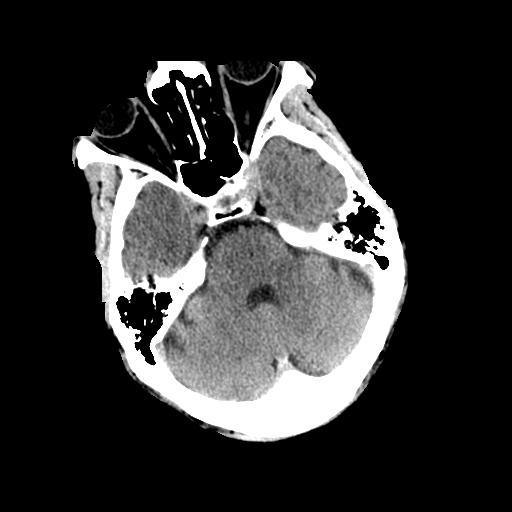
[im 20/40  brain]
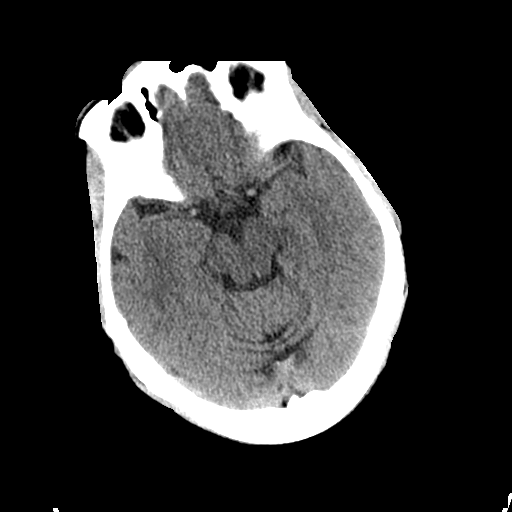
[im 20/40  bone]
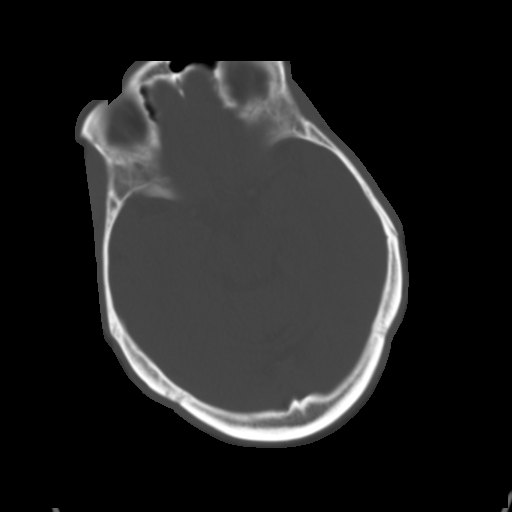
[im 23/40  brain]
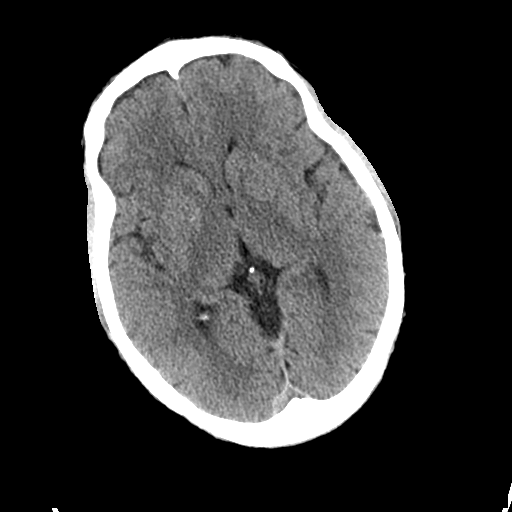
[im 28/40  brain]
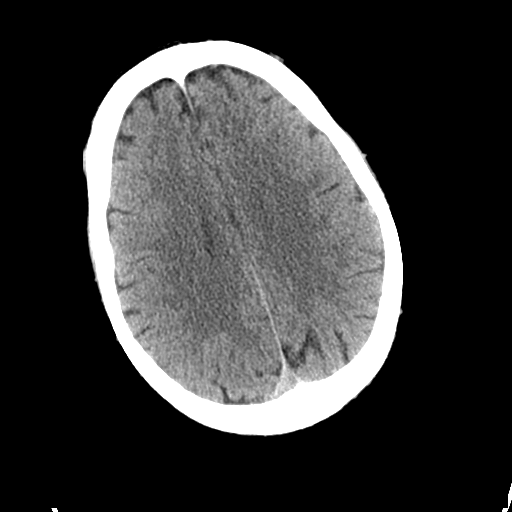
[im 31/40  brain]
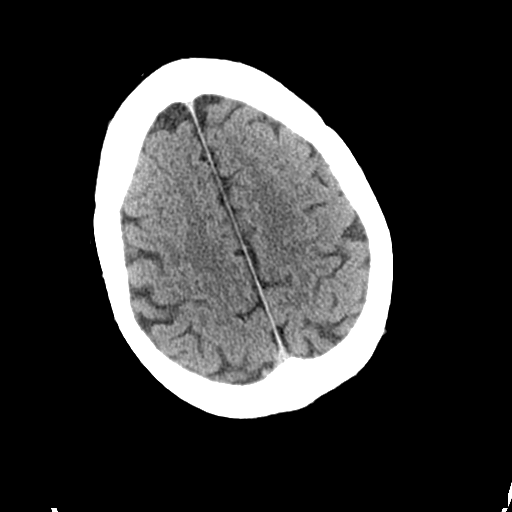
[im 37/40  brain]
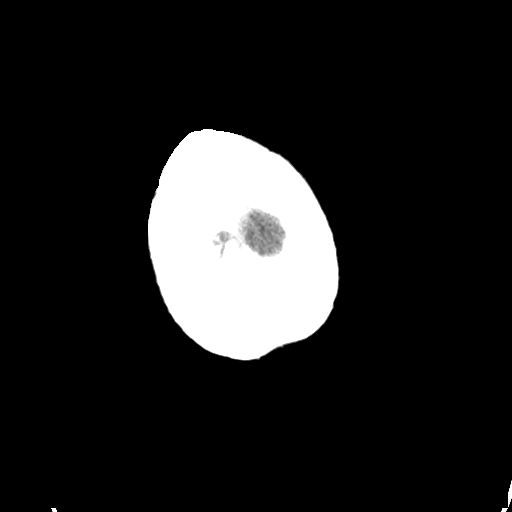
[im 37/40  bone]
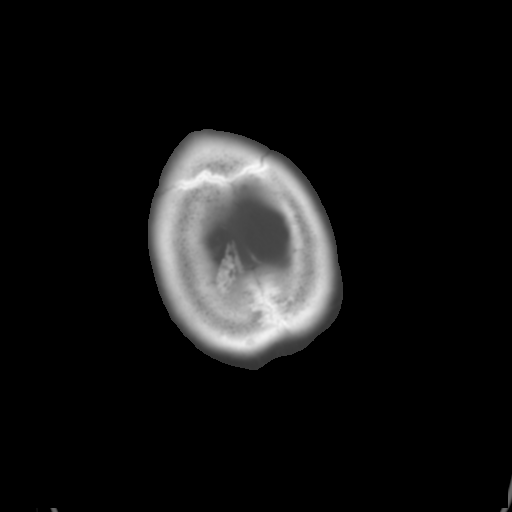

[Series 5: coronal soft · coronal · 0.33mm/px · 3 of 76 slices shown]
[im 26/76  brain]
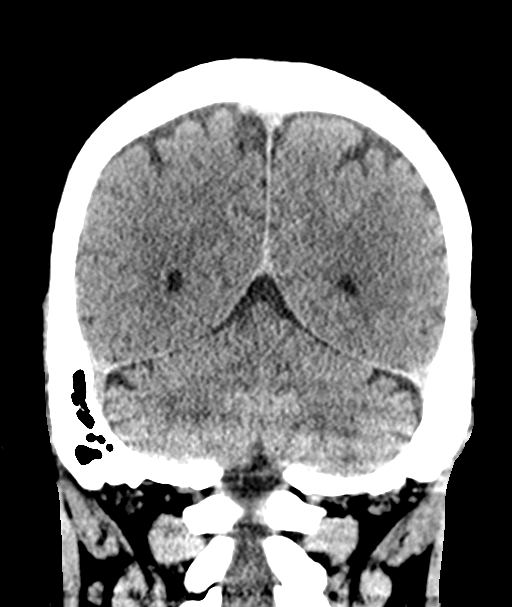
[im 34/76  brain]
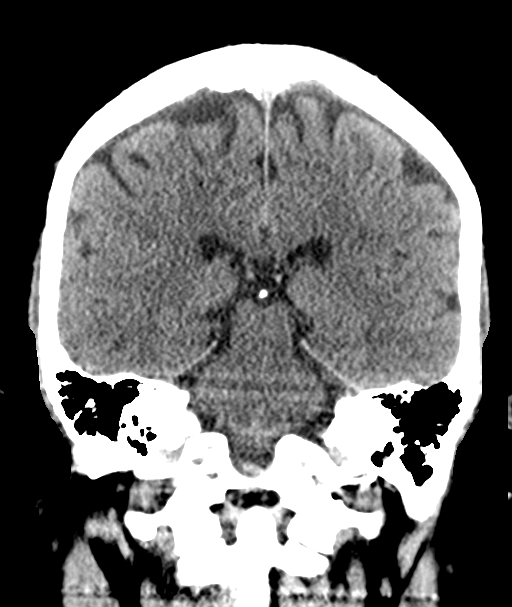
[im 42/76  brain]
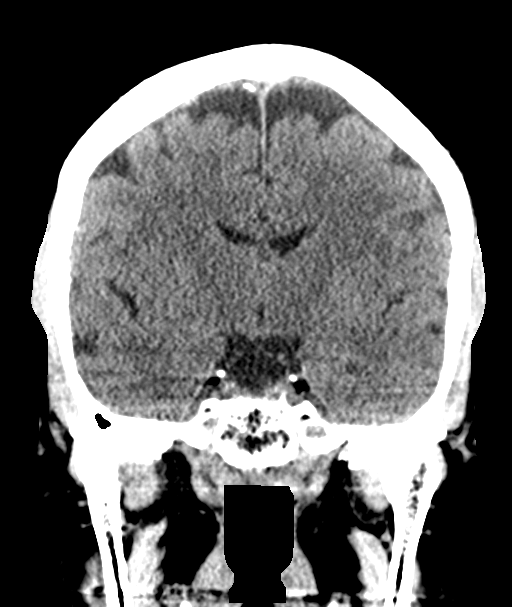

[Series 6: sagittal soft · sagittal · 0.39mm/px · 3 of 56 slices shown]
[im 19/56  brain]
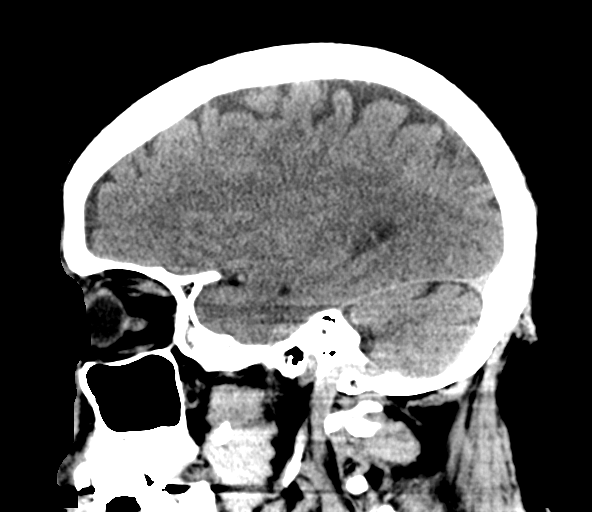
[im 28/56  brain]
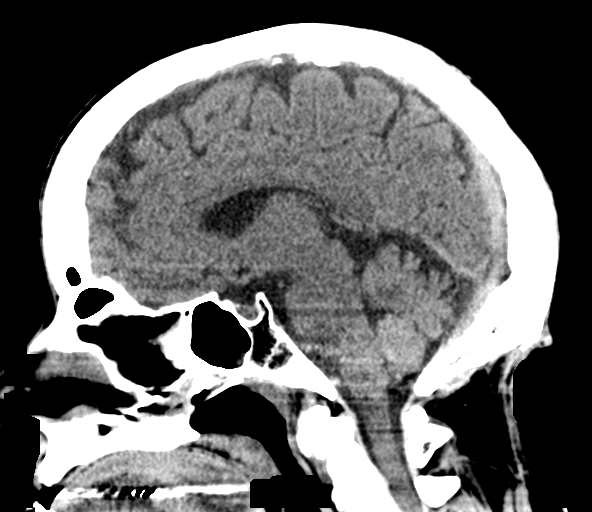
[im 37/56  brain]
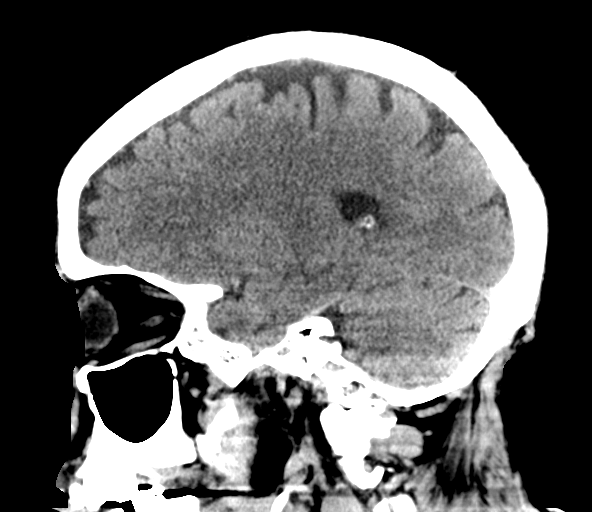

[15 of 47 positions shown; findings below may reference images not displayed]

FINDINGS: Brain: No acute infarct, hemorrhage, or mass lesion is present. No
significant white matter lesions are present. Basal ganglia are
within normal limits. Insular ribbon is normal.

Vascular: No hyperdense vessel or unexpected calcification.

Skull: Calvarium is intact. No focal lytic or blastic lesions are
present. No significant extracranial soft tissue lesion is present.

Sinuses/Orbits: The paranasal sinuses and mastoid air cells are
clear. Marked lucency present about the roots of the first premolar
tooth in the right maxilla. No adjacent soft tissue abnormality is
present. Remote orbital blowout fractures are seen bilaterally.
Globes and orbits are otherwise within normal.

ASPECTS ([place] Stroke Program Early CT Score)

- Ganglionic level infarction (caudate, lentiform nuclei, internal
capsule, insula, M1-M3 cortex): [DATE]

- Supraganglionic infarction (M4-M6 cortex): [DATE]

Total score (0-10 with 10 being normal): [DATE]
IMPRESSION: 1. Normal CT appearance of the brain.
2. ASPECTS is [DATE].
3. Marked lucency about the roots of the first premolar tooth in the
right maxilla. Question dental infection.

These results were called by telephone at the time of interpretation
on 12/04/2019 at [DATE] to provider DORIZ SAETERN , who verbally
acknowledged these results.

## 2022-03-31 ENCOUNTER — Emergency Department (HOSPITAL_COMMUNITY): Payer: Self-pay

## 2022-03-31 ENCOUNTER — Inpatient Hospital Stay (HOSPITAL_COMMUNITY)
Admission: EM | Admit: 2022-03-31 | Discharge: 2022-04-03 | DRG: 871 | Disposition: A | Discharge status: Home / Self Care | Payer: Self-pay | Attending: Internal Medicine | Admitting: Internal Medicine

## 2022-03-31 ENCOUNTER — Inpatient Hospital Stay (HOSPITAL_COMMUNITY): Payer: Self-pay

## 2022-03-31 ENCOUNTER — Emergency Department (HOSPITAL_COMMUNITY)
Admit: 2022-03-31 | Discharge: 2022-03-31 | Disposition: A | Discharge status: Home / Self Care | Payer: Self-pay | Attending: Neurology | Admitting: Neurology

## 2022-03-31 ENCOUNTER — Other Ambulatory Visit: Payer: Self-pay

## 2022-03-31 DIAGNOSIS — E7220 Disorder of urea cycle metabolism, unspecified: Secondary | ICD-10-CM | POA: Diagnosis present

## 2022-03-31 DIAGNOSIS — F17210 Nicotine dependence, cigarettes, uncomplicated: Secondary | ICD-10-CM | POA: Diagnosis present

## 2022-03-31 DIAGNOSIS — R531 Weakness: Secondary | ICD-10-CM

## 2022-03-31 DIAGNOSIS — Z79899 Other long term (current) drug therapy: Secondary | ICD-10-CM

## 2022-03-31 DIAGNOSIS — F1910 Other psychoactive substance abuse, uncomplicated: Secondary | ICD-10-CM | POA: Diagnosis present

## 2022-03-31 DIAGNOSIS — R030 Elevated blood-pressure reading, without diagnosis of hypertension: Secondary | ICD-10-CM | POA: Diagnosis present

## 2022-03-31 DIAGNOSIS — G9341 Metabolic encephalopathy: Secondary | ICD-10-CM | POA: Diagnosis present

## 2022-03-31 DIAGNOSIS — R6520 Severe sepsis without septic shock: Secondary | ICD-10-CM | POA: Diagnosis present

## 2022-03-31 DIAGNOSIS — G8191 Hemiplegia, unspecified affecting right dominant side: Secondary | ICD-10-CM | POA: Diagnosis present

## 2022-03-31 DIAGNOSIS — R4182 Altered mental status, unspecified: Secondary | ICD-10-CM

## 2022-03-31 DIAGNOSIS — D696 Thrombocytopenia, unspecified: Secondary | ICD-10-CM | POA: Diagnosis present

## 2022-03-31 DIAGNOSIS — Y906 Blood alcohol level of 120-199 mg/100 ml: Secondary | ICD-10-CM | POA: Diagnosis present

## 2022-03-31 DIAGNOSIS — R680 Hypothermia, not associated with low environmental temperature: Secondary | ICD-10-CM | POA: Diagnosis present

## 2022-03-31 DIAGNOSIS — D539 Nutritional anemia, unspecified: Secondary | ICD-10-CM | POA: Diagnosis present

## 2022-03-31 DIAGNOSIS — F419 Anxiety disorder, unspecified: Secondary | ICD-10-CM | POA: Diagnosis present

## 2022-03-31 DIAGNOSIS — E872 Acidosis, unspecified: Secondary | ICD-10-CM | POA: Diagnosis present

## 2022-03-31 DIAGNOSIS — F10139 Alcohol abuse with withdrawal, unspecified: Secondary | ICD-10-CM | POA: Diagnosis present

## 2022-03-31 DIAGNOSIS — F32A Depression, unspecified: Secondary | ICD-10-CM | POA: Diagnosis present

## 2022-03-31 DIAGNOSIS — A419 Sepsis, unspecified organism: Principal | ICD-10-CM | POA: Diagnosis present

## 2022-03-31 DIAGNOSIS — E876 Hypokalemia: Secondary | ICD-10-CM | POA: Diagnosis present

## 2022-03-31 DIAGNOSIS — I10 Essential (primary) hypertension: Secondary | ICD-10-CM

## 2022-03-31 DIAGNOSIS — R29810 Facial weakness: Secondary | ICD-10-CM | POA: Diagnosis present

## 2022-03-31 DIAGNOSIS — R591 Generalized enlarged lymph nodes: Secondary | ICD-10-CM | POA: Diagnosis present

## 2022-03-31 DIAGNOSIS — K709 Alcoholic liver disease, unspecified: Secondary | ICD-10-CM | POA: Diagnosis present

## 2022-03-31 DIAGNOSIS — R270 Ataxia, unspecified: Secondary | ICD-10-CM | POA: Diagnosis present

## 2022-03-31 DIAGNOSIS — J690 Pneumonitis due to inhalation of food and vomit: Secondary | ICD-10-CM | POA: Insufficient documentation

## 2022-03-31 DIAGNOSIS — R5383 Other fatigue: Secondary | ICD-10-CM | POA: Diagnosis present

## 2022-03-31 DIAGNOSIS — R29718 NIHSS score 18: Secondary | ICD-10-CM | POA: Diagnosis present

## 2022-03-31 DIAGNOSIS — F10931 Alcohol use, unspecified with withdrawal delirium: Secondary | ICD-10-CM | POA: Diagnosis present

## 2022-03-31 DIAGNOSIS — Z1152 Encounter for screening for COVID-19: Secondary | ICD-10-CM

## 2022-03-31 LAB — PROTIME-INR
INR: 1.3 — ABNORMAL HIGH (ref 0.8–1.2)
Prothrombin Time: 16.4 seconds — ABNORMAL HIGH (ref 11.4–15.2)

## 2022-03-31 LAB — RAPID URINE DRUG SCREEN, HOSP PERFORMED
Amphetamines: NOT DETECTED
Barbiturates: NOT DETECTED
Benzodiazepines: NOT DETECTED
Cocaine: NOT DETECTED
Opiates: NOT DETECTED
Tetrahydrocannabinol: NOT DETECTED

## 2022-03-31 LAB — RESP PANEL BY RT-PCR (RSV, FLU A&B, COVID)  RVPGX2
Influenza A by PCR: NEGATIVE
Influenza B by PCR: NEGATIVE
Resp Syncytial Virus by PCR: NEGATIVE
SARS Coronavirus 2 by RT PCR: NEGATIVE

## 2022-03-31 LAB — LACTIC ACID, PLASMA
Lactic Acid, Venous: 3.3 mmol/L (ref 0.5–1.9)
Lactic Acid, Venous: 3 mmol/L (ref 0.5–1.9)
Lactic Acid, Venous: 3.2 mmol/L (ref 0.5–1.9)
Lactic Acid, Venous: 2.4 mmol/L (ref 0.5–1.9)

## 2022-03-31 LAB — DIFFERENTIAL
Abs Immature Granulocytes: 0.01 10*3/uL (ref 0.00–0.07)
Basophils Absolute: 0 10*3/uL (ref 0.0–0.1)
Basophils Relative: 0 %
Eosinophils Absolute: 0.2 10*3/uL (ref 0.0–0.5)
Eosinophils Relative: 3 %
Immature Granulocytes: 0 %
Lymphocytes Relative: 43 %
Lymphs Abs: 2.6 10*3/uL (ref 0.7–4.0)
Monocytes Absolute: 0.4 10*3/uL (ref 0.1–1.0)
Monocytes Relative: 6 %
Neutro Abs: 2.8 10*3/uL (ref 1.7–7.7)
Neutrophils Relative %: 48 %

## 2022-03-31 LAB — CBC
HCT: 34.9 % — ABNORMAL LOW (ref 39.0–52.0)
Hemoglobin: 12.3 g/dL — ABNORMAL LOW (ref 13.0–17.0)
MCH: 37.2 pg — ABNORMAL HIGH (ref 26.0–34.0)
MCHC: 35.2 g/dL (ref 30.0–36.0)
MCV: 105.4 fL — ABNORMAL HIGH (ref 80.0–100.0)
Platelets: 149 10*3/uL — ABNORMAL LOW (ref 150–400)
RBC: 3.31 MIL/uL — ABNORMAL LOW (ref 4.22–5.81)
RDW: 16 % — ABNORMAL HIGH (ref 11.5–15.5)
WBC: 5.9 10*3/uL (ref 4.0–10.5)
nRBC: 0 % (ref 0.0–0.2)

## 2022-03-31 LAB — URINALYSIS, W/ REFLEX TO CULTURE (INFECTION SUSPECTED)
Bacteria, UA: NONE SEEN
Bilirubin Urine: NEGATIVE
Glucose, UA: NEGATIVE mg/dL
Hgb urine dipstick: NEGATIVE
Ketones, ur: NEGATIVE mg/dL
Leukocytes,Ua: NEGATIVE
Nitrite: NEGATIVE
Protein, ur: NEGATIVE mg/dL
Specific Gravity, Urine: 1.005 (ref 1.005–1.030)
pH: 7 (ref 5.0–8.0)

## 2022-03-31 LAB — COMPREHENSIVE METABOLIC PANEL
ALT: 38 U/L (ref 0–44)
AST: 64 U/L — ABNORMAL HIGH (ref 15–41)
Albumin: 3.9 g/dL (ref 3.5–5.0)
Alkaline Phosphatase: 64 U/L (ref 38–126)
Anion gap: 10 (ref 5–15)
BUN: <5 mg/dL — ABNORMAL LOW (ref 6–20)
CO2: 22 mmol/L (ref 22–32)
Calcium: 8.2 mg/dL — ABNORMAL LOW (ref 8.9–10.3)
Chloride: 108 mmol/L (ref 98–111)
Creatinine, Ser: 0.71 mg/dL (ref 0.61–1.24)
GFR, Estimated: >60 mL/min (ref >60)
Glucose, Bld: 131 mg/dL — ABNORMAL HIGH (ref 70–99)
Potassium: 4 mmol/L (ref 3.5–5.1)
Sodium: 140 mmol/L (ref 135–145)
Total Bilirubin: 1.5 mg/dL — ABNORMAL HIGH (ref 0.3–1.2)
Total Protein: 7.2 g/dL (ref 6.5–8.1)

## 2022-03-31 LAB — ETHANOL: Alcohol, Ethyl (B): 135 mg/dL — ABNORMAL HIGH (ref <10)

## 2022-03-31 LAB — APTT: aPTT: 28 seconds (ref 24–36)

## 2022-03-31 MED ORDER — SODIUM CHLORIDE 0.9 % IV BOLUS: 1000.0000 mL | Freq: Once | INTRAVENOUS | Status: DC

## 2022-03-31 MED ORDER — LORAZEPAM 2 MG/ML IJ SOLN
1.0000 mg | INTRAMUSCULAR | Status: DC | PRN
  Administered 2022-04-01: 3 mg via INTRAVENOUS
  Administered 2022-04-01: 4 mg via INTRAVENOUS
  Administered 2022-04-02 (×2): 2 mg via INTRAVENOUS
  Filled 2022-03-31: qty 1
  Filled 2022-03-31: qty 2
  Filled 2022-03-31: qty 1
  Filled 2022-03-31: qty 2

## 2022-03-31 MED ORDER — ADULT MULTIVITAMIN W/MINERALS CH
1.0000 | ORAL_TABLET | Freq: Every day | ORAL | Status: DC
  Administered 2022-03-31 – 2022-04-03 (×4): 1 via ORAL
  Filled 2022-03-31 (×5): qty 1

## 2022-03-31 MED ORDER — HYDRALAZINE HCL 20 MG/ML IJ SOLN
5.0000 mg | INTRAMUSCULAR | Status: DC | PRN
  Administered 2022-03-31 – 2022-04-01 (×3): 5 mg via INTRAVENOUS
  Filled 2022-03-31 (×3): qty 1

## 2022-03-31 MED ORDER — VANCOMYCIN HCL 1250 MG/250ML IV SOLN
1250.0000 mg | Freq: Two times a day (BID) | INTRAVENOUS | Status: DC
  Administered 2022-04-01: 1250 mg via INTRAVENOUS
  Filled 2022-03-31 (×2): qty 250

## 2022-03-31 MED ORDER — LORAZEPAM 1 MG PO TABS
1.0000 mg | ORAL_TABLET | ORAL | Status: DC | PRN
  Administered 2022-04-02: 1 mg via ORAL
  Filled 2022-03-31: qty 1

## 2022-03-31 MED ORDER — VANCOMYCIN HCL IN DEXTROSE 1-5 GM/200ML-% IV SOLN
1000.0000 mg | Freq: Once | INTRAVENOUS | Status: AC
  Administered 2022-03-31: 1000 mg via INTRAVENOUS
  Filled 2022-03-31: qty 200

## 2022-03-31 MED ORDER — SODIUM CHLORIDE 0.9 % IV SOLN
2.0000 g | Freq: Once | INTRAVENOUS | Status: AC
  Administered 2022-03-31: 2 g via INTRAVENOUS
  Filled 2022-03-31: qty 12.5

## 2022-03-31 MED ORDER — ENOXAPARIN SODIUM 40 MG/0.4ML IJ SOSY
40.0000 mg | PREFILLED_SYRINGE | INTRAMUSCULAR | Status: DC
  Administered 2022-03-31 – 2022-04-02 (×3): 40 mg via SUBCUTANEOUS
  Filled 2022-03-31 (×3): qty 0.4

## 2022-03-31 MED ORDER — THIAMINE HCL 100 MG/ML IJ SOLN
100.0000 mg | Freq: Every day | INTRAMUSCULAR | Status: DC
  Administered 2022-03-31 – 2022-04-01 (×2): 100 mg via INTRAVENOUS
  Filled 2022-03-31 (×2): qty 2

## 2022-03-31 MED ORDER — ONDANSETRON HCL 4 MG PO TABS: 4.0000 mg | ORAL_TABLET | Freq: Four times a day (QID) | ORAL | Status: DC | PRN

## 2022-03-31 MED ORDER — SODIUM CHLORIDE 0.9% FLUSH
3.0000 mL | Freq: Once | INTRAVENOUS | Status: AC
  Administered 2022-03-31: 3 mL via INTRAVENOUS

## 2022-03-31 MED ORDER — ONDANSETRON HCL 4 MG/2ML IJ SOLN: 4.0000 mg | Freq: Four times a day (QID) | INTRAMUSCULAR | Status: DC | PRN

## 2022-03-31 MED ORDER — SODIUM CHLORIDE 0.9 % IV SOLN
2.0000 g | Freq: Three times a day (TID) | INTRAVENOUS | Status: DC
  Administered 2022-03-31 – 2022-04-02 (×5): 2 g via INTRAVENOUS
  Filled 2022-03-31 (×5): qty 12.5

## 2022-03-31 MED ORDER — SODIUM CHLORIDE 0.9 % IV BOLUS
2000.0000 mL | Freq: Once | INTRAVENOUS | Status: AC
  Administered 2022-03-31: 2000 mL via INTRAVENOUS

## 2022-03-31 MED ORDER — SODIUM CHLORIDE 0.9 % IV SOLN: INTRAVENOUS | Status: DC

## 2022-03-31 MED ORDER — FOLIC ACID 1 MG PO TABS
1.0000 mg | ORAL_TABLET | Freq: Every day | ORAL | Status: DC
  Administered 2022-03-31 – 2022-04-03 (×4): 1 mg via ORAL
  Filled 2022-03-31 (×5): qty 1

## 2022-03-31 MED ORDER — THIAMINE MONONITRATE 100 MG PO TABS
100.0000 mg | ORAL_TABLET | Freq: Every day | ORAL | Status: DC
  Administered 2022-04-02: 100 mg via ORAL
  Filled 2022-03-31 (×2): qty 1

## 2022-03-31 MED ORDER — LORAZEPAM 2 MG/ML IJ SOLN
0.5000 mg | Freq: Once | INTRAMUSCULAR | Status: AC
  Administered 2022-03-31: 0.5 mg via INTRAVENOUS
  Filled 2022-03-31: qty 1

## 2022-03-31 MED ORDER — METRONIDAZOLE 500 MG/100ML IV SOLN
500.0000 mg | Freq: Once | INTRAVENOUS | Status: AC
  Administered 2022-03-31: 500 mg via INTRAVENOUS
  Filled 2022-03-31: qty 100

## 2022-03-31 NOTE — Progress Notes (Signed)
EEG complete - results pending 

## 2022-03-31 NOTE — Progress Notes (Signed)
Pharmacy Antibiotic Note  Tommy Ewing is a 57 y.o. male admitted on 03/31/2022 with  unknown source of infection .  Pharmacy has been consulted for vancomycin and cefepime dosing.  Plan: Vancomycin 1250 mg IV every 12 hours. Cefepime 2000 mg IV every 8 hours. Monitor labs, c/s, and vanco levels as indicated. Weight: 74.4 kg (164 lb)  Temp (24hrs), Avg:93.3 F (34.1 C), Min:93.3 F (34.1 C), Max:93.3 F (34.1 C)  Recent Labs  Lab 03/31/22 1355 03/31/22 1449 03/31/22 1600  WBC 5.9  --   --   CREATININE 0.71  --   --   LATICACIDVEN  --  3.3* 3.0*    CrCl cannot be calculated (Unknown ideal weight.).    No Known Allergies  Antimicrobials this admission: Vanco 3/22 >> Cefepime 3/22 >> Flagyl 3/22   Microbiology results: 3/22 BCx: pending 3/22 UCx: pending    Thank you for allowing pharmacy to be a part of this patient's care.  Ramond Craver 03/31/2022 4:33 PM

## 2022-03-31 NOTE — ED Notes (Signed)
MD called off code stroke. Other neurological avenues being evaluated

## 2022-03-31 NOTE — ED Notes (Signed)
Pt taken off bair hugger. Rectal temp 97.

## 2022-03-31 NOTE — Progress Notes (Signed)
Patient has been trying to get out of bed since the start of shift. Staff members have been constantly having to redirect patient and get him back in bed. He is pulling at tubes and tele wires, taking his clothes off. Order placed for a Air cabin crew. AC and MD notified

## 2022-03-31 NOTE — ED Notes (Signed)
Rectal temp of 93.32f.  Dr. Roderic Palau and nurse Caryl Pina notified.  Bear hugger placed, however Pt unable to stay still (kicking feet in air) for bear hugger to stay in place.  Nurse notified.

## 2022-03-31 NOTE — ED Provider Notes (Signed)
[facility] Provider Note   CSN: [id number] Arrival date & time: 03/31/22  1331     History  Chief Complaint  Patient presents with   Code Stroke    John Grant is a 57 y.o. male.  Patient has a history of substance abuse and has been admitted for encephalopathy.  Initially were told he was normal at 6 PM when he arrived in the emergency department and a code stroke was called.  Additional history later was that for last couple days he has been kind of lethargic and was worse today  The history is provided by a relative. No language interpreter was used.  Altered Mental Status Presenting symptoms: behavior changes   Severity:  Moderate Most recent episode:  Today Episode history:  Continuous Timing:  Constant Progression:  Waxing and waning Chronicity:  New Context: not alcohol use   Associated symptoms: no abdominal pain        Home Medications Prior to Admission medications   Medication Sig Start Date End Date Taking? Authorizing Provider  citalopram (CELEXA) 20 MG tablet Take 1 tablet (20 mg total) by mouth daily. 01/25/19   Roxan Hockey, MD  folic acid (FOLVITE) 1 MG tablet Take 1 tablet (1 mg total) by mouth daily. 01/26/19   Roxan Hockey, MD  Gabapentin, Once-Daily, 300 MG TABS Take 1 tablet by mouth 3 (three) times daily. 01/25/19   Roxan Hockey, MD  hydrOXYzine (ATARAX/VISTARIL) 25 MG tablet Take 1 tablet (25 mg total) by mouth every 8 (eight) hours as needed for anxiety, itching or nausea. 01/25/19   Roxan Hockey, MD  Multiple Vitamin (MULTIVITAMIN WITH MINERALS) TABS tablet Take 1 tablet by mouth daily. Patient not taking: Reported on 01/23/2019 08/17/18   Roxan Hockey, MD  thiamine 100 MG tablet Take 1 tablet (100 mg total) by mouth daily. 01/26/19   Roxan Hockey, MD  traZODone (DESYREL) 100 MG tablet Take 1 tablet (100 mg total) by mouth at bedtime. 01/25/19   Roxan Hockey, MD      Allergies     Patient has no known allergies.    Review of Systems   Review of Systems  Unable to perform ROS: Mental status change  Gastrointestinal:  Negative for abdominal pain.    Physical Exam Updated Vital Signs BP (!) 151/106   Temp (!) 93.3 F (34.1 C) (Rectal)   Resp (!) 21  Physical Exam Vitals and nursing note reviewed.  Constitutional:      Appearance: He is well-developed.     Comments: Lethargic  HENT:     Head: Normocephalic.     Nose: Nose normal.  Eyes:     General: No scleral icterus.    Conjunctiva/sclera: Conjunctivae normal.  Neck:     Thyroid: No thyromegaly.  Cardiovascular:     Rate and Rhythm: Normal rate and regular rhythm.     Heart sounds: No murmur heard.    No friction rub. No gallop.  Pulmonary:     Breath sounds: No stridor. No wheezing or rales.  Chest:     Chest wall: No tenderness.  Abdominal:     General: There is no distension.     Tenderness: There is no abdominal tenderness. There is no rebound.  Musculoskeletal:        General: Normal range of motion.     Cervical back: Neck supple.  Lymphadenopathy:     Cervical: No cervical adenopathy.  Skin:    Findings: No erythema or  rash.  Neurological:     Motor: No abnormal muscle tone.     Coordination: Coordination normal.     Comments: Patient very lethargic and will not answer questions.  He does move all extremities to painful stimuli     ED Results / Procedures / Treatments   Labs (all labs ordered are listed, but only abnormal results are displayed) Labs Reviewed  PROTIME-INR - Abnormal; Notable for the following components:      Result Value   Prothrombin Time 16.4 (*)    INR 1.3 (*)    All other components within normal limits  CBC - Abnormal; Notable for the following components:   RBC 3.31 (*)    Hemoglobin 12.3 (*)    HCT 34.9 (*)    MCV 105.4 (*)    MCH 37.2 (*)    RDW 16.0 (*)    Platelets 149 (*)    All other components within normal limits  COMPREHENSIVE  METABOLIC PANEL - Abnormal; Notable for the following components:   Glucose, Bld 131 (*)    BUN <5 (*)    Calcium 8.2 (*)    AST 64 (*)    Total Bilirubin 1.5 (*)    All other components within normal limits  ETHANOL - Abnormal; Notable for the following components:   Alcohol, Ethyl (B) 135 (*)    All other components within normal limits  LACTIC ACID, PLASMA - Abnormal; Notable for the following components:   Lactic Acid, Venous 3.3 (*)    All other components within normal limits  CULTURE, BLOOD (ROUTINE X 2)  CULTURE, BLOOD (ROUTINE X 2)  RESP PANEL BY RT-PCR (RSV, FLU A&B, COVID)  RVPGX2  URINE CULTURE  APTT  DIFFERENTIAL  RAPID URINE DRUG SCREEN, HOSP PERFORMED  URINALYSIS, ROUTINE W REFLEX MICROSCOPIC  LACTIC ACID, PLASMA  I-STAT CHEM 8, ED  CBG MONITORING, ED  I-STAT CHEM 8, ED    EKG None  Radiology CT HEAD CODE STROKE WO CONTRAST  Result Date: 03/31/2022 CLINICAL DATA:  Code stroke. Left-sided weakness, left-sided facial droop, aphasia EXAM: CT HEAD WITHOUT CONTRAST TECHNIQUE: Contiguous axial images were obtained from the base of the skull through the vertex without intravenous contrast. RADIATION DOSE REDUCTION: This exam was performed according to the departmental dose-optimization program which includes automated exposure control, adjustment of the mA and/or kV according to patient size and/or use of iterative reconstruction technique. COMPARISON:  12/04/2019 FINDINGS: Brain: No evidence of acute infarction, hemorrhage, mass, mass effect, or midline shift. No hydrocephalus or extra-axial collection. Vascular: No hyperdense vessel. Skull: Negative for fracture or focal lesion. Sinuses/Orbits: Mucosal thickening in the right greater than left maxillary sinus and right sphenoid sinus. Redemonstrated bilateral lamina papyracea fractures. No acute finding in the orbits. Other: The mastoid air cells are well aerated. ASPECTS [facility] Stroke Program Early CT Score) - Ganglionic  level infarction (caudate, lentiform nuclei, internal capsule, insula, M1-M3 cortex): 7 - Supraganglionic infarction (M4-M6 cortex): 3 Total score (0-10 with 10 being normal): 10 IMPRESSION: 1. No acute intracranial process. 2. ASPECTS is 10. Code stroke imaging results were communicated on 03/31/2022 at 1:49 pm to provider Crit Obremski via telephone, who verbally acknowledged these results. Electronically Signed   By: Merilyn Baba M.D.   On: 03/31/2022 13:50    Procedures Procedures    Medications Ordered in ED Medications  ceFEPIme (MAXIPIME) 2 g in sodium chloride 0.9 % 100 mL IVPB (2 g Intravenous New Bag/Given 03/31/22 1536)  metroNIDAZOLE (FLAGYL) IVPB 500 mg (has  no administration in time range)  vancomycin (VANCOCIN) IVPB 1000 mg/200 mL premix (has no administration in time range)  sodium chloride 0.9 % bolus 2,000 mL (has no administration in time range)  sodium chloride flush (NS) 0.9 % injection 3 mL (3 mLs Intravenous Given 03/31/22 1537)  sodium chloride 0.9 % bolus 1,000 mL (1,000 mLs Intravenous New Bag/Given 03/31/22 1538)  LORazepam (ATIVAN) injection 0.5 mg (0.5 mg Intravenous Given 03/31/22 1532)    ED Course/ Medical Decision Making/ A&P  Patient with altered mental status.  He was seen by neurology and they do not feel like he has had a stroke but there is some question about seizure activity and postictal.  Patient is also hypothermic and has been treated for sepsis protocol CRITICAL CARE Performed by: Milton Ferguson Total critical care time: 45 minutes Critical care time was exclusive of separately billable procedures and treating other patients. Critical care was necessary to treat or prevent imminent or life-threatening deterioration. Critical care was time spent personally by me on the following activities: development of treatment plan with patient and/or surrogate as well as nursing, discussions with consultants, evaluation of patient's response to treatment, examination of  patient, obtaining history from patient or surrogate, ordering and performing treatments and interventions, ordering and review of laboratory studies, ordering and review of radiographic studies, pulse oximetry and re-evaluation of patient's condition.  Click here for ABCD2, HEART and other calculatorsREFRESH Note before signing :1}                          Medical Decision Making Amount and/or Complexity of Data Reviewed Labs: ordered. Radiology: ordered. ECG/medicine tests: ordered.  Risk Prescription drug management. Decision regarding hospitalization.  This patient presents to the ED for concern of altered mental status, this involves an extensive number of treatment options, and is a complaint that carries with it a high risk of complications and morbidity.  The differential diagnosis includes stroke, metabolic encephalopathy   Co morbidities that complicate the patient evaluation  Substance abuse and alcohol use   Additional history obtained:  Additional history obtained from family External records from outside source obtained and reviewed including hospital records   Lab Tests:  I Ordered, and personally interpreted labs.  The pertinent results include: WBC 5.9, lactic 3.3, EtOH 135   Imaging Studies ordered:  I ordered imaging studies including chest x-ray and CT head I independently visualized and interpreted imaging which showed negative I agree with the radiologist interpretation   Cardiac Monitoring: / EKG:  The patient was maintained on a cardiac monitor.  I personally viewed and interpreted the cardiac monitored which showed an underlying rhythm of: Normal sinus rhythm   Consultations Obtained:  I requested consultation with the neurology and hospitalist,  and discussed lab and imaging findings as well as pertinent plan - they recommend: Admit to hospitalist.  Neurology stated there is no stroke   Problem List / ED Course / Critical interventions /  Medication management  Altered mental status and hypothermia I ordered medication including antibiotics and normal saline Reevaluation of the patient after these medicines showed that the patient stayed the same I have reviewed the patients home medicines and have made adjustments as needed   Social Determinants of Health:  EtOH previous   Test / Admission - Considered:  EEG  Patient with sepsis and hypothermia and altered mental status.  He will be admitted to medicine        Final  Clinical Impression(s) / ED Diagnoses Final diagnoses:  Altered mental status, unspecified altered mental status type    Rx / DC Orders ED Discharge Orders     None         Milton Ferguson, MD 03/31/22 1758

## 2022-03-31 NOTE — Procedures (Signed)
Patient Name: John Grant  MRN: [id number]  Epilepsy Attending: Lora Havens  Referring Physician/Provider: Kerney Elbe, MD  Date: 03/31/2022 Duration: 24.08 mins  Patient history: 57yo M with ams. EEG to evaluate for seizure  Level of alertness: lethargic   AEDs during EEG study: None  Technical aspects: This EEG study was done with scalp electrodes positioned according to the 10-20 International system of electrode placement. Electrical activity was reviewed with band pass filter of 1-70Hz , sensitivity of 7 uV/mm, display speed of 61mm/sec with a 60Hz  notched filter applied as appropriate. EEG data were recorded continuously and digitally stored.  Video monitoring was available and reviewed as appropriate.  Description: EEG showed continuous generalized and maximal bifrontal rhythmic 2-3hz  delta slowing. Hyperventilation and photic stimulation were not performed.     ABNORMALITY - Rhythmic delta activity, generalized and maximal bifrontal  IMPRESSION: This study is  suggestive of non-specific cortical dysfunction arising from bifrontal region. Additionally there is severe diffuse encephalopathy, nonspecific etiology. No seizures or epileptiform discharges were seen throughout the recording.  If suspicion for interictal activity remains a concern, a prolonged study can be considered.   [name] [name] [name]

## 2022-03-31 NOTE — Consult Note (Signed)
Code stroke activated at 1333. Pt left for CT at this time.   Dr Cheral Marker paged at 1334 and again when pt returned from [place] at 1345. Dr Cheral Marker on camera at this time for neuro exam.   LKW 1800. mRS 0.   Off camera at 1403.   Biagio Borg, Telestroke RN

## 2022-03-31 NOTE — H&P (Signed)
History and Physical    Patient: John Grant [id number] DOB: 1965-07-31 DOA: 03/31/2022 DOS: the patient was seen and examined on 03/31/2022 PCP: Pcp, No  Patient coming from: Home  Chief Complaint:  Chief Complaint  Patient presents with   Code Stroke   HPI: John Grant is a 57 y.o. male with medical history significant of alcohol and substance abuse, anxiety/depression. He was brought to the hospital by EMS due to altered mental status. He was last known well last night around 6pm. His sister checked on him at 1pm today - noted that he was confused. EMS was called - they noted right facial droop, right sided weakness and left lateral gaze. He was transported to the hospital for evaluation. Here, he was found to have a negative head CT and was examined by teleneurologist. He was found to be hypothermic and had a lactic acid level of 3.3. Blood cultures were obtained and antibiotics for sepsis was started. An EEG was also ordered.  He had a similar presentation about 5 months ago - aphasia with left sided deficits - and had a negative workup.  Review of Systems: Unable to review all systems due to lack of cooperation from patient. Past Medical History:  Diagnosis Date   Anxiety    Depression    Metabolic encephalopathy    Polysubstance abuse ([place])    No past surgical history on file. Social History:  reports that he has been smoking cigarettes. He has been smoking an average of 1 pack per day. He has never used smokeless tobacco. He reports current alcohol use. He reports current drug use. Drug: Marijuana.  No Known Allergies  No family history on file.  Prior to Admission medications   Medication Sig Start Date End Date Taking? Authorizing Provider  citalopram (CELEXA) 20 MG tablet Take 1 tablet (20 mg total) by mouth daily. Patient not taking: Reported on 03/31/2022 01/25/19   Roxan Hockey, MD  folic acid (FOLVITE) 1 MG tablet Take 1 tablet (1 mg total) by mouth  daily. Patient not taking: Reported on 03/31/2022 01/26/19   Roxan Hockey, MD  Gabapentin, Once-Daily, 300 MG TABS Take 1 tablet by mouth 3 (three) times daily. Patient not taking: Reported on 03/31/2022 01/25/19   Roxan Hockey, MD  hydrOXYzine (ATARAX/VISTARIL) 25 MG tablet Take 1 tablet (25 mg total) by mouth every 8 (eight) hours as needed for anxiety, itching or nausea. Patient not taking: Reported on 03/31/2022 01/25/19   Roxan Hockey, MD  Multiple Vitamin (MULTIVITAMIN WITH MINERALS) TABS tablet Take 1 tablet by mouth daily. Patient not taking: Reported on 01/23/2019 08/17/18   Roxan Hockey, MD  thiamine 100 MG tablet Take 1 tablet (100 mg total) by mouth daily. Patient not taking: Reported on 03/31/2022 01/26/19   Roxan Hockey, MD  traZODone (DESYREL) 100 MG tablet Take 1 tablet (100 mg total) by mouth at bedtime. Patient not taking: Reported on 03/31/2022 01/25/19   Roxan Hockey, MD    Physical Exam: Vitals:   03/31/22 1345 03/31/22 1400 03/31/22 1428  BP: (!) 151/106    Resp: 20 (!) 21   Temp:   (!) 93.3 F (34.1 C)  TempSrc:   Rectal   General: middle age male. hypersomnolent. No acute cardiopulmonary distress.  HEENT: Normocephalic atraumatic.  Right and left ears normal in appearance.  Pupils equal, round, reactive to light. Extraocular muscles are intact. Sclerae anicteric and noninjected.  Moist mucosal membranes. No mucosal lesions.  Neck: Neck supple without lymphadenopathy. No carotid bruits. No  masses palpated.  Cardiovascular: Regular rate with normal S1-S2 sounds. No murmurs, rubs, gallops auscultated. No JVD.  Respiratory: rhonchorous breathing. No rales.  No accessory muscle use. Abdomen: Soft, nondistended.  No masses or hepatosplenomegaly  Skin: No rashes, lesions, or ulcerations.  Dry, warm to touch. 2+ dorsalis pedis and radial pulses. Musculoskeletal: No calf or leg pain. All major joints not erythematous nontender.  No upper or lower joint  deformation.  No contractures  Psychiatric: unable to assess. Neurologic: unable to assess.  Data Reviewed: Results for orders placed or performed during the hospital encounter of 03/31/22 (from the past 24 hour(s))  Protime-INR     Status: Abnormal   Collection Time: 03/31/22  1:55 PM  Result Value Ref Range   Prothrombin Time 16.4 (H) 11.4 - 15.2 seconds   INR 1.3 (H) 0.8 - 1.2  APTT     Status: None   Collection Time: 03/31/22  1:55 PM  Result Value Ref Range   aPTT 28 24 - 36 seconds  CBC     Status: Abnormal   Collection Time: 03/31/22  1:55 PM  Result Value Ref Range   WBC 5.9 4.0 - 10.5 K/uL   RBC 3.31 (L) 4.22 - 5.81 MIL/uL   Hemoglobin 12.3 (L) 13.0 - 17.0 g/dL   HCT 34.9 (L) 39.0 - 52.0 %   MCV 105.4 (H) 80.0 - 100.0 fL   MCH 37.2 (H) 26.0 - 34.0 pg   MCHC 35.2 30.0 - 36.0 g/dL   RDW 16.0 (H) 11.5 - 15.5 %   Platelets 149 (L) 150 - 400 K/uL   nRBC 0.0 0.0 - 0.2 %  Differential     Status: None   Collection Time: 03/31/22  1:55 PM  Result Value Ref Range   Neutrophils Relative % 48 %   Neutro Abs 2.8 1.7 - 7.7 K/uL   Lymphocytes Relative 43 %   Lymphs Abs 2.6 0.7 - 4.0 K/uL   Monocytes Relative 6 %   Monocytes Absolute 0.4 0.1 - 1.0 K/uL   Eosinophils Relative 3 %   Eosinophils Absolute 0.2 0.0 - 0.5 K/uL   Basophils Relative 0 %   Basophils Absolute 0.0 0.0 - 0.1 K/uL   Immature Granulocytes 0 %   Abs Immature Granulocytes 0.01 0.00 - 0.07 K/uL  Comprehensive metabolic panel     Status: Abnormal   Collection Time: 03/31/22  1:55 PM  Result Value Ref Range   Sodium 140 135 - 145 mmol/L   Potassium 4.0 3.5 - 5.1 mmol/L   Chloride 108 98 - 111 mmol/L   CO2 22 22 - 32 mmol/L   Glucose, Bld 131 (H) 70 - 99 mg/dL   BUN <5 (L) 6 - 20 mg/dL   Creatinine, Ser 0.71 0.61 - 1.24 mg/dL   Calcium 8.2 (L) 8.9 - 10.3 mg/dL   Total Protein 7.2 6.5 - 8.1 g/dL   Albumin 3.9 3.5 - 5.0 g/dL   AST 64 (H) 15 - 41 U/L   ALT 38 0 - 44 U/L   Alkaline Phosphatase 64 38 - 126  U/L   Total Bilirubin 1.5 (H) 0.3 - 1.2 mg/dL   GFR, Estimated >60 >60 mL/min   Anion gap 10 5 - 15  Ethanol     Status: Abnormal   Collection Time: 03/31/22  1:55 PM  Result Value Ref Range   Alcohol, Ethyl (B) 135 (H) <10 mg/dL  Lactic acid, plasma     Status: Abnormal   Collection Time:  03/31/22  2:49 PM  Result Value Ref Range   Lactic Acid, Venous 3.3 (HH) 0.5 - 1.9 mmol/L  Blood Culture (routine x 2)     Status: None (Preliminary result)   Collection Time: 03/31/22  2:49 PM   Specimen: BLOOD  Result Value Ref Range   Specimen Description BLOOD BLOOD LEFT ARM    Special Requests      BOTTLES DRAWN AEROBIC AND ANAEROBIC Blood Culture adequate volume Performed at [facility], [address], [place], [place] [address]    Culture PENDING    Report Status PENDING   Blood Culture (routine x 2)     Status: None (Preliminary result)   Collection Time: 03/31/22  2:50 PM   Specimen: BLOOD  Result Value Ref Range   Specimen Description BLOOD BLOOD LEFT HAND    Special Requests      BOTTLES DRAWN AEROBIC AND ANAEROBIC Blood Culture adequate volume Performed at [facility], [address], [place], [place] [address]    Culture PENDING    Report Status PENDING     CT HEAD CODE STROKE WO CONTRAST  Result Date: 03/31/2022 CLINICAL DATA:  Code stroke. Left-sided weakness, left-sided facial droop, aphasia EXAM: CT HEAD WITHOUT CONTRAST TECHNIQUE: Contiguous axial images were obtained from the base of the skull through the vertex without intravenous contrast. RADIATION DOSE REDUCTION: This exam was performed according to the departmental dose-optimization program which includes automated exposure control, adjustment of the mA and/or kV according to patient size and/or use of iterative reconstruction technique. COMPARISON:  12/04/2019 FINDINGS: Brain: No evidence of acute infarction, hemorrhage, mass, mass effect, or midline shift. No hydrocephalus or extra-axial collection. Vascular: No  hyperdense vessel. Skull: Negative for fracture or focal lesion. Sinuses/Orbits: Mucosal thickening in the right greater than left maxillary sinus and right sphenoid sinus. Redemonstrated bilateral lamina papyracea fractures. No acute finding in the orbits. Other: The mastoid air cells are well aerated. ASPECTS [facility] Stroke Program Early CT Score) - Ganglionic level infarction (caudate, lentiform nuclei, internal capsule, insula, M1-M3 cortex): 7 - Supraganglionic infarction (M4-M6 cortex): 3 Total score (0-10 with 10 being normal): 10 IMPRESSION: 1. No acute intracranial process. 2. ASPECTS is 10. Code stroke imaging results were communicated on 03/31/2022 at 1:49 pm to provider ZAMMIT via telephone, who verbally acknowledged these results. Electronically Signed   By: Merilyn Baba M.D.   On: 03/31/2022 13:50     Assessment and Plan: No notes have been filed under this hospital service. Service: Hospitalist  Principal Problem:   Sepsis ([place]) Active Problems:   Acute metabolic encephalopathy   Polysubstance abuse ([facility])   Elevated BP without diagnosis of hypertension  Sepsis Blood cultures Urine culture CXR pending. Antibiotics covering undifferentiated sepsis Bearhugger in place. Acute metabolic encephalopathy Differential includes alcohol/substance abuse intoxication, post-ictal state, sepsis EEG in process Will treat sepsis and watch for alcohol withdrawal Urine tox screen pending Elevated BP Hydralazine for elevated BP.   Advance Care Planning:   Code Status: Full Code presumed  Consults: teleneurologist   Family Communication: none available.  Severity of Illness: The appropriate patient status for this patient is INPATIENT. Inpatient status is judged to be reasonable and necessary in order to provide the required intensity of service to ensure the patient's safety. The patient's presenting symptoms, physical exam findings, and initial radiographic and laboratory data in the  context of their chronic comorbidities is felt to place them at high risk for further clinical deterioration. Furthermore, it is not anticipated that the patient  will be medically stable for discharge from the hospital within 2 midnights of admission.   * I certify that at the point of admission it is my clinical judgment that the patient will require inpatient hospital care spanning beyond 2 midnights from the point of admission due to high intensity of service, high risk for further deterioration and high frequency of surveillance required.*  Author: Truett Mainland, DO 03/31/2022 4:03 PM  For on call review [email or web address].

## 2022-03-31 NOTE — ED Triage Notes (Signed)
Pt bib [facility] due to ams. Pt was LNW yesterday 03/30/22 at 6pm by a niece. Who also states noticed he had not came out of room all day and checked on him about 1pm today and was altered so called EMS. Pt arrived alert but confused and unable to follow commands. Arrived to ED room at 1343. Pt pupils perrla and eyes unable to look to  right. Awaiting teleneuro doc

## 2022-03-31 NOTE — Sepsis Progress Note (Signed)
eLink is following this Code Sepsis. °

## 2022-03-31 NOTE — Consult Note (Signed)
[facility] TeleNeurology Consult Services    Date of Service:  03/31/2022     Metrics: Last Known Well: 6:00 PM yesterday Symptoms: As per HPI.  Patient is not a candidate for thrombolytic. Out of the time window.   Location of the provider: [facility]  Location of the patient: [facility] Pre-Morbid Modified Rankin Scale: 0 Time Code Stroke Page received:  1:34 PM Time neurologist arrived:  1:44 PM Time NIHSS completed: 2:00 PM    This consult was provided via telemedicine with 2-way video and audio communication. The patient/family was informed that care would be provided in this way and agreed to receive care in this manner.   ED Physician notified of diagnostic impression and management plan at: 2:08 PM   Assessment: 57 year old male presenting to the ED via EMS with altered mental status. Right sided weakness noted in the ED as well as left gaze deviation. Code Stroke was called  - Exam reveals a severely encephalopathic patient who is nonverbal with left gaze preference and right sided weakness in the context of ataxic and agitated movements of all 4 extremities.  - CT head: No acute intracranial process. ASPECTS is 10. - Overall appearance is most consistent with encephalopathy secondary to post-ictal state in conjunction with Sherren Mocha' paralysis on the right, due to an unwitnessed seizure.  - If seizure has occurred, most likely would be due to EtOH withdrawal, but undiagnosed epilepsy is also on the DDx.  - Although stroke could present with his right sided deficits, his exam was improving over the course of about 10 minutes, which would make postictal state more likely. He is not a TNK candidate due to time criteria. Will assess for possible LVO with STAT CTA of head and neck. - eGFR > 60     Recommendations: - UDS - EtOH level - Toxic/metabolic/infectious work up - EEG (ordered) - 2 mg IV Ativan now - STAT CTA of head and neck.  (ordered) - CIWA protocol      ------------------------------------------------------------------------------   History of Present Illness: 57 year old male presenting to the ED via EMS for acute onset of AMS with LKN at 1800 yesterday. He had not come out of his room all day today. She checked on him at 1:00 PM and she noted him to be confused at that time. She then called EMS. On arrival the patient had right facial droop, right sided weakness and inability to look to the right.    He has had similar presentations in the past, last in November with aphasia and left sided deficits with negative stroke work up. Past episodes have been diagnosed as secondary to substance use and/or detoxing from EtOH.       Past Medical History: Past Medical History:  Diagnosis Date   Anxiety    Depression    Metabolic encephalopathy    Polysubstance abuse ([place])      Past Surgical History: No past surgical history on file.    Medications:  No current facility-administered medications on file prior to encounter.   Current Outpatient Medications on File Prior to Encounter  Medication Sig Dispense Refill   citalopram (CELEXA) 20 MG tablet Take 1 tablet (20 mg total) by mouth daily. 30 tablet 5   folic acid (FOLVITE) 1 MG tablet Take 1 tablet (1 mg total) by mouth daily. 30 tablet 2   Gabapentin, Once-Daily, 300 MG TABS Take 1 tablet by mouth 3 (three) times daily. 180 tablet 1  hydrOXYzine (ATARAX/VISTARIL) 25 MG tablet Take 1 tablet (25 mg total) by mouth every 8 (eight) hours as needed for anxiety, itching or nausea. 30 tablet 0   Multiple Vitamin (MULTIVITAMIN WITH MINERALS) TABS tablet Take 1 tablet by mouth daily. (Patient not taking: Reported on 01/23/2019) 120 tablet 2   thiamine 100 MG tablet Take 1 tablet (100 mg total) by mouth daily. 30 tablet 4   traZODone (DESYREL) 100 MG tablet Take 1 tablet (100 mg total) by mouth at bedtime. 30 tablet 5         Social History: Substance  abuse EtOH abuse   Family History:  Reviewed in Epic   ROS: As per HPI    Anticoagulant use:  No   Antiplatelet use: No   Examination:    BP (!) 151/106   Resp (!) 21    1A: Level of Consciousness - 2 1B: Ask Month and Age - 2 1C: Blink Eyes & Squeeze Hands - 2 2: Test Horizontal Extraocular Movements - 1 3: Test Visual Fields - 0 4: Test Facial Palsy (Use Grimace if Obtunded) - 0 5A: Test Left Arm Motor Drift - 0 5B: Test Right Arm Motor Drift - 2 6A: Test Left Leg Motor Drift - 0 6B: Test Right Leg Motor Drift - 1 7: Test Limb Ataxia (FNF/Heel-Shin) - 0 8: Test Sensation -  2 9: Test Language/Aphasia - 3 10: Test Dysarthria -: 2 11: Test Extinction/Inattention - Extinction to bilateral simultaneous stimulation 1   NIHSS Score: 18      Patient/Family was informed the Neurology Consult would occur via TeleHealth consult by way of interactive audio and video telecommunications and consented to receiving care in this manner.   Patient is being evaluated for possible acute neurologic impairment and high pretest probability of imminent or life-threatening deterioration. I spent total of 40 minutes providing care to this patient, including time for face to face visit via telemedicine, review of medical records, imaging studies and discussion of findings with providers, the patient and/or family.   Electronically signed: Dr. Kerney Elbe

## 2022-04-01 ENCOUNTER — Inpatient Hospital Stay (HOSPITAL_COMMUNITY): Payer: Self-pay

## 2022-04-01 DIAGNOSIS — F1910 Other psychoactive substance abuse, uncomplicated: Secondary | ICD-10-CM

## 2022-04-01 DIAGNOSIS — J690 Pneumonitis due to inhalation of food and vomit: Secondary | ICD-10-CM | POA: Insufficient documentation

## 2022-04-01 DIAGNOSIS — E7220 Disorder of urea cycle metabolism, unspecified: Secondary | ICD-10-CM

## 2022-04-01 DIAGNOSIS — G9341 Metabolic encephalopathy: Secondary | ICD-10-CM

## 2022-04-01 DIAGNOSIS — E876 Hypokalemia: Secondary | ICD-10-CM

## 2022-04-01 DIAGNOSIS — A419 Sepsis, unspecified organism: Secondary | ICD-10-CM

## 2022-04-01 DIAGNOSIS — R718 Other abnormality of red blood cells: Secondary | ICD-10-CM

## 2022-04-01 DIAGNOSIS — F10931 Alcohol use, unspecified with withdrawal delirium: Secondary | ICD-10-CM

## 2022-04-01 LAB — HEPATIC FUNCTION PANEL
ALT: 36 U/L (ref 0–44)
AST: 89 U/L — ABNORMAL HIGH (ref 15–41)
Albumin: 3.3 g/dL — ABNORMAL LOW (ref 3.5–5.0)
Alkaline Phosphatase: 57 U/L (ref 38–126)
Bilirubin, Direct: 0.2 mg/dL (ref 0.0–0.2)
Indirect Bilirubin: 1.1 mg/dL — ABNORMAL HIGH (ref 0.3–0.9)
Total Bilirubin: 1.3 mg/dL — ABNORMAL HIGH (ref 0.3–1.2)
Total Protein: 6 g/dL — ABNORMAL LOW (ref 6.5–8.1)

## 2022-04-01 LAB — BASIC METABOLIC PANEL
Anion gap: 10 (ref 5–15)
BUN: 5 mg/dL — ABNORMAL LOW (ref 6–20)
CO2: 17 mmol/L — ABNORMAL LOW (ref 22–32)
Calcium: 7.7 mg/dL — ABNORMAL LOW (ref 8.9–10.3)
Chloride: 114 mmol/L — ABNORMAL HIGH (ref 98–111)
Creatinine, Ser: 0.7 mg/dL (ref 0.61–1.24)
GFR, Estimated: >60 mL/min (ref >60)
Glucose, Bld: 133 mg/dL — ABNORMAL HIGH (ref 70–99)
Potassium: 2.9 mmol/L — ABNORMAL LOW (ref 3.5–5.1)
Sodium: 141 mmol/L (ref 135–145)

## 2022-04-01 LAB — PROCALCITONIN: Procalcitonin: <0.1 ng/mL

## 2022-04-01 LAB — LACTIC ACID, PLASMA
Lactic Acid, Venous: 0.8 mmol/L (ref 0.5–1.9)
Lactic Acid, Venous: 0.8 mmol/L (ref 0.5–1.9)

## 2022-04-01 LAB — GLUCOSE, CAPILLARY
Glucose-Capillary: 105 mg/dL — ABNORMAL HIGH (ref 70–99)
Glucose-Capillary: 125 mg/dL — ABNORMAL HIGH (ref 70–99)
Glucose-Capillary: 135 mg/dL — ABNORMAL HIGH (ref 70–99)
Glucose-Capillary: 141 mg/dL — ABNORMAL HIGH (ref 70–99)
Glucose-Capillary: 144 mg/dL — ABNORMAL HIGH (ref 70–99)

## 2022-04-01 LAB — FOLATE: Folate: 15.6 ng/mL (ref >5.9)

## 2022-04-01 LAB — CBC
HCT: 29.8 % — ABNORMAL LOW (ref 39.0–52.0)
Hemoglobin: 10.3 g/dL — ABNORMAL LOW (ref 13.0–17.0)
MCH: 37.1 pg — ABNORMAL HIGH (ref 26.0–34.0)
MCHC: 34.6 g/dL (ref 30.0–36.0)
MCV: 107.2 fL — ABNORMAL HIGH (ref 80.0–100.0)
Platelets: 122 10*3/uL — ABNORMAL LOW (ref 150–400)
RBC: 2.78 MIL/uL — ABNORMAL LOW (ref 4.22–5.81)
RDW: 15.9 % — ABNORMAL HIGH (ref 11.5–15.5)
WBC: 7.4 10*3/uL (ref 4.0–10.5)
nRBC: 0 % (ref 0.0–0.2)

## 2022-04-01 LAB — VITAMIN B12: Vitamin B-12: 148 pg/mL — ABNORMAL LOW (ref 180–914)

## 2022-04-01 LAB — AMMONIA: Ammonia: 39 umol/L — ABNORMAL HIGH (ref 9–35)

## 2022-04-01 LAB — HIV ANTIBODY (ROUTINE TESTING W REFLEX): HIV Screen 4th Generation wRfx: NONREACTIVE

## 2022-04-01 MED ORDER — CHLORHEXIDINE GLUCONATE CLOTH 2 % EX PADS
6.0000 | MEDICATED_PAD | Freq: Every day | CUTANEOUS | Status: DC
  Administered 2022-04-01 – 2022-04-03 (×3): 6 via TOPICAL

## 2022-04-01 MED ORDER — LACTULOSE 10 GM/15ML PO SOLN
20.0000 g | Freq: Three times a day (TID) | ORAL | Status: DC
  Administered 2022-04-01: 20 g via ORAL
  Filled 2022-04-01: qty 30

## 2022-04-01 MED ORDER — POTASSIUM CHLORIDE CRYS ER 20 MEQ PO TBCR
40.0000 meq | EXTENDED_RELEASE_TABLET | Freq: Once | ORAL | Status: AC
  Administered 2022-04-01: 40 meq via ORAL
  Filled 2022-04-01: qty 2

## 2022-04-01 MED ORDER — VITAMIN B-12 100 MCG PO TABS
500.0000 ug | ORAL_TABLET | Freq: Every day | ORAL | Status: DC
  Administered 2022-04-02 – 2022-04-03 (×2): 500 ug via ORAL
  Filled 2022-04-01 (×2): qty 5

## 2022-04-01 MED ORDER — AMLODIPINE BESYLATE 5 MG PO TABS
5.0000 mg | ORAL_TABLET | Freq: Every day | ORAL | Status: DC
  Administered 2022-04-01 – 2022-04-03 (×3): 5 mg via ORAL
  Filled 2022-04-01 (×3): qty 1

## 2022-04-01 MED ORDER — POTASSIUM CHLORIDE 10 MEQ/100ML IV SOLN
10.0000 meq | INTRAVENOUS | Status: AC
  Administered 2022-04-01 (×4): 10 meq via INTRAVENOUS
  Filled 2022-04-01 (×4): qty 100

## 2022-04-01 MED ORDER — CYANOCOBALAMIN 1000 MCG/ML IJ SOLN
1000.0000 ug | Freq: Once | INTRAMUSCULAR | Status: AC
  Administered 2022-04-01: 1000 ug via INTRAMUSCULAR
  Filled 2022-04-01: qty 1

## 2022-04-01 MED ORDER — DEXMEDETOMIDINE HCL IN NACL 400 MCG/100ML IV SOLN
0.4000 ug/kg/h | INTRAVENOUS | Status: DC
  Administered 2022-04-01: 0.4 ug/kg/h via INTRAVENOUS
  Filled 2022-04-01 (×2): qty 100

## 2022-04-01 NOTE — Progress Notes (Addendum)
Rapid Response  Rapid response was called due to patient being in RED MEWS with HR in the 170s and SBP > 200.  Patient was admitted with suspicion for sepsis and acute metabolic encephalopathy possibly due to alcohol/substance abuse intoxication.  Alcohol level was noted to be 135.  Prior to this, patient has been confused, restless and trying to get out of bed.  A 1 person sitter was just requested prior to onset of patient's condition. At bedside, patient was somnolent (he just received about 3 mg of Ativan per RN), it was decided for him to be transferred to stepdown unit.  Upon arrival to the stepdown unit, patient continues to be agitated, trying to get out of bed, trying to pull out IV lines.  It appears that patient was withdrawing from alcohol and since he continues to be agitated while on CIWA protocol, it was added to start patient on Precedex for better control of his agitation.  Shortly after being on Precedex, tachypnea and tachycardia resolved and BP became better controlled.   Ammonia this morning was 39     Latest Ref Rng & Units 04/01/2022    4:18 AM 03/31/2022    1:55 PM 12/04/2019    7:52 PM  CBC  WBC 4.0 - 10.5 K/uL 7.4  5.9    Hemoglobin 13.0 - 17.0 g/dL 10.3  12.3  14.3   Hematocrit 39.0 - 52.0 % 29.8  34.9  42.0   Platelets 150 - 400 K/uL 122  149         Latest Ref Rng & Units 04/01/2022    4:18 AM 03/31/2022    1:55 PM 12/04/2019    7:52 PM  BMP  Glucose 70 - 99 mg/dL 133  131  141   BUN 6 - 20 mg/dL 5  <5  5   Creatinine 0.61 - 1.24 mg/dL 0.70  0.71  1.00   Sodium 135 - 145 mmol/L 141  140  148   Potassium 3.5 - 5.1 mmol/L 2.9  4.0  3.6   Chloride 98 - 111 mmol/L 114  108  114   CO2 22 - 32 mmol/L 17  22    Calcium 8.9 - 10.3 mg/dL 7.7  8.2      Assessment and plan Acute metabolic encephalopathy possibly due to multifactorial including alcohol intoxication vs withdrawal AST: ALT 89: 36 > 2:1 Alcohol level was 135 Continue IV Precedex and CIWA  protocol Continue folic acid, thiamine and multivitamin Continue fall precaution  Mild hyperammonemia Ammonia slightly elevated at 39 Continue lactulose  Lactic acidosis Lactic acid 3.3 > 3.0 > 3.2 Continue IV hydration Continue to trend lactic acid  Hypokalemia K+ 2.9, this was replenished Continue to monitor potassium levels with morning labs  Thrombocytopenia Platelets 149 > 122 This may be due to alcohol effect Continue to monitor platelet levels  Elevated MCV MCV 107.2, this may also be related to alcohol effect Vitamin B12 and folate levels will be checked  Please refer to admission H&P for details regarding the care of this patient.  Critical time: 43 minutes   Critical care personally provided  managing the patient due to high probability of clinically significant and life threatening deterioration. This critical care time included obtaining a history; examining the patient, pulse oximetry; ordering and review of studies; arranging urgent treatment with development of a management plan; evaluation of patient's response of treatment; frequent reassessment; and discussions with other providers.  This critical care time was performed to assess  and manage the high probability of imminent and life threatening deterioration that could result in multi-organ failure.

## 2022-04-01 NOTE — Hospital Course (Addendum)
58 year old male with a history of alcohol abuse, polysubstance abuse (previously cocaine), anxiety presenting with confusion and decreased responsiveness.  The patient himself does not recall of the events prior to his admission.  History is supplemented by review of the medical record and speaking with the patient's sister with whom the patient lives.  Apparently, the patient has been his usual self on the evening of 03/30/2022.  However, his sister checked up on him on the afternoon of 03/31/2022 and noted the patient to be confused and not speaking properly.  EMS was activated.  There was concern for right facial droop and right-sided weakness and left gaze preference.  Code stroke was activated.  The patient was seen by Dr.Lindzen who felt that the patient's overall appearance was consistent with encephalopathy secondary to postictal state in conjunction with Todd's paralysis on the right due to an unwitnessed seizure.  Stroke was felt to be less likely as the patient's deficits were improving even during his examination.  The patient had a similar admission in January 2021 when he had elevated blood alcohol level and ammonemia with altered mental status change.  The patient's sister states that the patient has been drinking 4-5 beers almost on a daily basis.  The patient also drinks some wine.  It is unclear exactly when he last ingested alcohol.  However, his alcohol level was 135 in the emergency department. The patient was admitted for further evaluation and treatment.  In the evening of 03/31/2022, the patient had increased agitation.  He was placed on a Precedex drip.

## 2022-04-01 NOTE — Progress Notes (Signed)
PROGRESS NOTE  John Grant [id number] DOB: 03/05/1965 DOA: 03/31/2022 PCP: Pcp, No  Brief History:  57 year old male with a history of alcohol abuse, polysubstance abuse (previously cocaine), anxiety presenting with confusion and decreased responsiveness.  The patient himself does not recall of the events prior to his admission.  History is supplemented by review of the medical record and speaking with the patient's sister with whom the patient lives.  Apparently, the patient has been his usual self on the evening of 03/30/2022.  However, his sister checked up on him on the afternoon of 03/31/2022 and noted the patient to be confused and not speaking properly.  EMS was activated.  There was concern for right facial droop and right-sided weakness and left gaze preference.  Code stroke was activated.  The patient was seen by Dr.Lindzen who felt that the patient's overall appearance was consistent with encephalopathy secondary to postictal state in conjunction with Todd's paralysis on the right due to an unwitnessed seizure.  Stroke was felt to be less likely as the patient's deficits were improving even during his examination. The patient's sister states that the patient has been drinking 4-5 beers almost on a daily basis.  The patient also drinks some wine.  It is unclear exactly when he last ingested alcohol.  However, his alcohol level was 135 in the emergency department. The patient was admitted for further evaluation and treatment.  In the evening of 03/31/2022, the patient had increased agitation.  He was placed on a Precedex drip.   Assessment/Plan: Sepsis -present on admission -Patient presented with tachycardia, tachypnea, and hypothermia -Secondary to aspiration pneumonitis -Lactic acid peaked at 3.3>>2.4>>0.8 -Check PCT <0.10 -Personally reviewed CXR with right lower lobe opacity -Follow-up blood cultures -UA negative for pyuria  Acute metabolic encephalopathy -Secondary  to alcohol withdrawal, and infectious process -Placed on Precedex drip evening 03/31/2022 -Weaned off Precedex and on 04/01/2022>> patient is more lucid and following commands -[id number] -Folic acid -Ammonia 39 -EEG negative for seizure -UDS negative  Aspiration pneumonitis -Discontinue vancomycin -Continue cefepime  Alcohol withdrawal -weaned off Precedex 3/23  Hyperammonemia -Unclear clinical significance -Mental status is improving  Macrocytic anemia -Low [id number] likely -Folic acid [id number]  Hypokalemia -Replete -Check magnesium  Thrombocytopenia -Secondary to alcoholic liver disease -Monitor for signs of bleeding -Repleting 123456 -Folic acid  Anxiety -Holding Celexa temporarily   Family Communication:   sister updated 3/23  Consultants:  none  Code Status:  FULL   DVT Prophylaxis:  [place] Lovenox   Procedures: As Listed in Progress Note Above  Antibiotics: Vanc 3/22>>3/23 Cefepime 3/22>>      Subjective: Patient denies fevers, chills, headache, chest pain, dyspnea, nausea, vomiting, diarrhea, abdominal pain, dysuria, hematuria, hematochezia, and melena.   Objective: Vitals:   04/01/22 0800 04/01/22 0900 04/01/22 1000 04/01/22 1124  BP: (!) 158/103 (!) 164/107  (!) 161/115  Pulse: 74 74 69 85  Resp: 16 15 14 15   Temp:    98.2 F (36.8 C)  TempSrc:    Axillary  SpO2: 96% 98% 97% 100%  Weight:      Height:        Intake/Output Summary (Last 24 hours) at 04/01/2022 1525 Last data filed at 04/01/2022 [id number] Gross per 24 hour  Intake 3418.48 ml  Output --  Net 3418.48 ml   Weight change:  Exam:  General:  Pt is alert, follows commands appropriately, not in acute distress HEENT: No icterus, No thrush, No neck mass,  [place]/AT Cardiovascular: RRR, S1/S2, no rubs, no gallops Respiratory: bibasilar rales, R>L. No wheeze Abdomen: Soft/+BS, non tender, non distended, no guarding Extremities: No edema, No lymphangitis, No petechiae, No rashes, no  synovitis   Data Reviewed: I have personally reviewed following labs and imaging studies Basic Metabolic Panel: Recent Labs  Lab 03/31/22 1355 04/01/22 0418  NA 140 141  K 4.0 2.9*  CL 108 114*  CO2 22 17*  GLUCOSE 131* 133*  BUN <5* 5*  CREATININE 0.71 0.70  CALCIUM 8.2* 7.7*   Liver Function Tests: Recent Labs  Lab 03/31/22 1355 04/01/22 0418  AST 64* 89*  ALT 38 36  ALKPHOS 64 57  BILITOT 1.5* 1.3*  PROT 7.2 6.0*  ALBUMIN 3.9 3.3*   No results for input(s): "LIPASE", "AMYLASE" in the last 168 hours. Recent Labs  Lab 04/01/22 0418  AMMONIA 39*   Coagulation Profile: Recent Labs  Lab 03/31/22 1355  INR 1.3*   CBC: Recent Labs  Lab 03/31/22 1355 04/01/22 0418  WBC 5.9 7.4  NEUTROABS 2.8  --   HGB 12.3* 10.3*  HCT 34.9* 29.8*  MCV 105.4* 107.2*  PLT 149* 122*   Cardiac Enzymes: No results for input(s): "CKTOTAL", "CKMB", "CKMBINDEX", "TROPONINI" in the last 168 hours. BNP: Invalid input(s): "POCBNP" CBG: Recent Labs  Lab 04/01/22 0032 04/01/22 0132 04/01/22 0459 04/01/22 0756 04/01/22 1158  GLUCAP 144* 141* 135* 125* 105*   HbA1C: No results for input(s): "HGBA1C" in the last 72 hours. Urine analysis:    Component Value Date/Time   COLORURINE COLORLESS (A) 03/31/2022 1836   APPEARANCEUR CLEAR 03/31/2022 1836   LABSPEC 1.005 03/31/2022 1836   PHURINE 7.0 03/31/2022 1836   GLUCOSEU NEGATIVE 03/31/2022 1836   HGBUR NEGATIVE 03/31/2022 1836   BILIRUBINUR NEGATIVE 03/31/2022 1836   KETONESUR NEGATIVE 03/31/2022 1836   PROTEINUR NEGATIVE 03/31/2022 1836   NITRITE NEGATIVE 03/31/2022 1836   LEUKOCYTESUR NEGATIVE 03/31/2022 1836   Sepsis Labs: @LABRCNTIP (procalcitonin:4,lacticidven:4) ) Recent Results (from the past 240 hour(s))  Blood Culture (routine x 2)     Status: None (Preliminary result)   Collection Time: 03/31/22  2:49 PM   Specimen: BLOOD  Result Value Ref Range Status   Specimen Description BLOOD BLOOD LEFT ARM  Final    Special Requests   Final    BOTTLES DRAWN AEROBIC AND ANAEROBIC Blood Culture adequate volume   Culture   Final    NO GROWTH < 24 HOURS Performed at [facility], [address] [place], [place] [address]    Report Status PENDING  Incomplete  Blood Culture (routine x 2)     Status: None (Preliminary result)   Collection Time: 03/31/22  2:50 PM   Specimen: BLOOD  Result Value Ref Range Status   Specimen Description BLOOD BLOOD LEFT HAND  Final   Special Requests   Final    BOTTLES DRAWN AEROBIC AND ANAEROBIC Blood Culture adequate volume   Culture   Final    NO GROWTH < 24 HOURS Performed at [facility], [address] [place], [place] [address]    Report Status PENDING  Incomplete  Resp panel by RT-PCR (RSV, Flu A&B, Covid) Anterior Nasal Swab     Status: None   Collection Time: 03/31/22  6:30 PM   Specimen: Anterior Nasal Swab  Result Value Ref Range Status   SARS Coronavirus 2 by RT PCR NEGATIVE NEGATIVE Final    Comment: (NOTE) SARS-CoV-2 target nucleic acids are NOT DETECTED.  The SARS-CoV-2 RNA is generally detectable  in upper respiratory specimens during the acute phase of infection. The lowest concentration of SARS-CoV-2 viral copies this assay can detect is 138 copies/mL. A negative result does not preclude SARS-Cov-2 infection and should not be used as the sole basis for treatment or other patient management decisions. A negative result may occur with  improper specimen collection/handling, submission of specimen other than nasopharyngeal swab, presence of viral mutation(s) within the areas targeted by this assay, and inadequate number of viral copies(<138 copies/mL). A negative result must be combined with clinical observations, patient history, and epidemiological information. The expected result is Negative.  Fact Sheet for Patients:  EntrepreneurPulse.com.au  Fact Sheet for Healthcare Providers:   IncredibleEmployment.be  This test is no t yet approved or cleared by the [place] FDA and  has been authorized for detection and/or diagnosis of SARS-CoV-2 by FDA under an Emergency Use Authorization (EUA). This EUA will remain  in effect (meaning this test can be used) for the duration of the COVID-19 declaration under Section 564(b)(1) of the Act, 21 U.S.C.section 360bbb-3(b)(1), unless the authorization is terminated  or revoked sooner.       Influenza A by PCR NEGATIVE NEGATIVE Final   Influenza B by PCR NEGATIVE NEGATIVE Final    Comment: (NOTE) The Xpert Xpress SARS-CoV-2/FLU/RSV plus assay is intended as an aid in the diagnosis of influenza from Nasopharyngeal swab specimens and should not be used as a sole basis for treatment. Nasal washings and aspirates are unacceptable for Xpert Xpress SARS-CoV-2/FLU/RSV testing.  Fact Sheet for Patients: EntrepreneurPulse.com.au  Fact Sheet for Healthcare Providers: IncredibleEmployment.be  This test is not yet approved or cleared by the [place] FDA and has been authorized for detection and/or diagnosis of SARS-CoV-2 by FDA under an Emergency Use Authorization (EUA). This EUA will remain in effect (meaning this test can be used) for the duration of the COVID-19 declaration under Section 564(b)(1) of the Act, 21 U.S.C. section 360bbb-3(b)(1), unless the authorization is terminated or revoked.     Resp Syncytial Virus by PCR NEGATIVE NEGATIVE Final    Comment: (NOTE) Fact Sheet for Patients: EntrepreneurPulse.com.au  Fact Sheet for Healthcare Providers: IncredibleEmployment.be  This test is not yet approved or cleared by the [place] FDA and has been authorized for detection and/or diagnosis of SARS-CoV-2 by FDA under an Emergency Use Authorization (EUA). This EUA will remain in effect (meaning this test can be used) for  the duration of the COVID-19 declaration under Section 564(b)(1) of the Act, 21 U.S.C. section 360bbb-3(b)(1), unless the authorization is terminated or revoked.  Performed at [facility], [address] [place], [place] [address]      Scheduled Meds:  Chlorhexidine Gluconate Cloth  6 each Topical Daily   enoxaparin (LOVENOX) injection  40 mg Subcutaneous [id number]   folic acid  1 mg Oral Daily   multivitamin with minerals  1 tablet Oral Daily   thiamine  100 mg Oral Daily   Or   thiamine  100 mg Intravenous Daily   Continuous Infusions:  sodium chloride Stopped (04/01/22 0519)   ceFEPime (MAXIPIME) IV 2 g (04/01/22 0740)   dexmedetomidine (PRECEDEX) IV infusion 0.7 mcg/kg/hr (04/01/22 [id number])   sodium chloride Stopped (Apr 09, 2022 1808)   vancomycin 166.7 mL/hr at 04/01/22 [id number]    Procedures/Studies: EEG adult  Result Date: 04-09-2022 Lora Havens, MD     04/09/22  4:48 PM Patient Name: Burtis Roscoe MRN: [id number] Epilepsy Attending: Lora Havens Referring Physician/Provider: Kerney Elbe, MD Date: 04/09/22 Duration: 24.08  mins Patient history: 57yo M with ams. EEG to evaluate for seizure Level of alertness: lethargic AEDs during EEG study: None Technical aspects: This EEG study was done with scalp electrodes positioned according to the 10-20 International system of electrode placement. Electrical activity was reviewed with band pass filter of 1-70Hz , sensitivity of 7 uV/mm, display speed of 46mm/sec with a 60Hz  notched filter applied as appropriate. EEG data were recorded continuously and digitally stored.  Video monitoring was available and reviewed as appropriate. Description: EEG showed continuous generalized and maximal bifrontal rhythmic 2-3hz  delta slowing. Hyperventilation and photic stimulation were not performed.   ABNORMALITY - Rhythmic delta activity, generalized and maximal bifrontal IMPRESSION: This study is  suggestive of non-specific cortical dysfunction arising  from bifrontal region. Additionally there is severe diffuse encephalopathy, nonspecific etiology. No seizures or epileptiform discharges were seen throughout the recording. If suspicion for interictal activity remains a concern, a prolonged study can be considered. Lora Havens   DG Chest Port 1 View  Result Date: 03/31/2022 CLINICAL DATA:  Possible sepsis EXAM: PORTABLE CHEST 1 VIEW COMPARISON:  01/23/2019 FINDINGS: Transverse diameter of heart is slightly increased. This may be partly due to poor inspiration. There are no signs of pulmonary edema or focal pulmonary consolidation. There is no pleural effusion or pneumothorax. IMPRESSION: There are no signs of pulmonary edema or focal pulmonary consolidation. Electronically Signed   By: Elmer Picker M.D.   On: 03/31/2022 16:38   CT HEAD CODE STROKE WO CONTRAST  Result Date: 03/31/2022 CLINICAL DATA:  Code stroke. Left-sided weakness, left-sided facial droop, aphasia EXAM: CT HEAD WITHOUT CONTRAST TECHNIQUE: Contiguous axial images were obtained from the base of the skull through the vertex without intravenous contrast. RADIATION DOSE REDUCTION: This exam was performed according to the departmental dose-optimization program which includes automated exposure control, adjustment of the mA and/or kV according to patient size and/or use of iterative reconstruction technique. COMPARISON:  12/04/2019 FINDINGS: Brain: No evidence of acute infarction, hemorrhage, mass, mass effect, or midline shift. No hydrocephalus or extra-axial collection. Vascular: No hyperdense vessel. Skull: Negative for fracture or focal lesion. Sinuses/Orbits: Mucosal thickening in the right greater than left maxillary sinus and right sphenoid sinus. Redemonstrated bilateral lamina papyracea fractures. No acute finding in the orbits. Other: The mastoid air cells are well aerated. ASPECTS [facility] Stroke Program Early CT Score) - Ganglionic level infarction (caudate, lentiform  nuclei, internal capsule, insula, M1-M3 cortex): 7 - Supraganglionic infarction (M4-M6 cortex): 3 Total score (0-10 with 10 being normal): 10 IMPRESSION: 1. No acute intracranial process. 2. ASPECTS is 10. Code stroke imaging results were communicated on 03/31/2022 at 1:49 pm to provider ZAMMIT via telephone, who verbally acknowledged these results. Electronically Signed   By: Merilyn Baba M.D.   On: 03/31/2022 13:50    Orson Eva, DO  [facility]  If 7PM-7AM, please contact night-coverage [email or web address] Password [facility] 04/01/2022, 3:25 PM   LOS: 1 day

## 2022-04-01 NOTE — Progress Notes (Signed)
Patient has been trying to get out of bed all shift, is confused, alert but disoriented, and is restless. Patient is responsive. HR extremely elevated and confirmed with activation of telemetry. VS obtained. Patient clammy, restless, and in distress. Assigned nurse informed of decision to call a rapid response. Upon reviewing chart, MEWS found to be RED at 2230. Lactic Acid resulted 2.3 at 2318. MD notified. Will continue to assist in patient care as needed.

## 2022-04-01 NOTE — Progress Notes (Signed)
Patient assist back in bed, dressed, and IV found to be infiltrated and 2nd IV leaking. Assigned nurse notified of IV.

## 2022-04-02 ENCOUNTER — Encounter (HOSPITAL_COMMUNITY): Payer: Self-pay | Admitting: Family Medicine

## 2022-04-02 DIAGNOSIS — I10 Essential (primary) hypertension: Secondary | ICD-10-CM

## 2022-04-02 LAB — T4, FREE: Free T4: 0.81 ng/dL (ref 0.61–1.12)

## 2022-04-02 LAB — COMPREHENSIVE METABOLIC PANEL
ALT: 34 U/L (ref 0–44)
AST: 56 U/L — ABNORMAL HIGH (ref 15–41)
Albumin: 4.1 g/dL (ref 3.5–5.0)
Alkaline Phosphatase: 87 U/L (ref 38–126)
Anion gap: 10 (ref 5–15)
BUN: 7 mg/dL (ref 6–20)
CO2: 17 mmol/L — ABNORMAL LOW (ref 22–32)
Calcium: 8.5 mg/dL — ABNORMAL LOW (ref 8.9–10.3)
Chloride: 110 mmol/L (ref 98–111)
Creatinine, Ser: 0.82 mg/dL (ref 0.61–1.24)
GFR, Estimated: >60 mL/min (ref >60)
Glucose, Bld: 100 mg/dL — ABNORMAL HIGH (ref 70–99)
Potassium: 3.4 mmol/L — ABNORMAL LOW (ref 3.5–5.1)
Sodium: 137 mmol/L (ref 135–145)
Total Bilirubin: 1.5 mg/dL — ABNORMAL HIGH (ref 0.3–1.2)
Total Protein: 7.6 g/dL (ref 6.5–8.1)

## 2022-04-02 LAB — TSH: TSH: 1.987 u[IU]/mL (ref 0.350–4.500)

## 2022-04-02 LAB — URINE CULTURE: Culture: NO GROWTH

## 2022-04-02 LAB — CBC
HCT: 36.7 % — ABNORMAL LOW (ref 39.0–52.0)
Hemoglobin: 12.5 g/dL — ABNORMAL LOW (ref 13.0–17.0)
MCH: 36.8 pg — ABNORMAL HIGH (ref 26.0–34.0)
MCHC: 34.1 g/dL (ref 30.0–36.0)
MCV: 107.9 fL — ABNORMAL HIGH (ref 80.0–100.0)
Platelets: 140 10*3/uL — ABNORMAL LOW (ref 150–400)
RBC: 3.4 MIL/uL — ABNORMAL LOW (ref 4.22–5.81)
RDW: 15.9 % — ABNORMAL HIGH (ref 11.5–15.5)
WBC: 8.4 10*3/uL (ref 4.0–10.5)
nRBC: 0.2 % (ref 0.0–0.2)

## 2022-04-02 LAB — MAGNESIUM: Magnesium: 1.6 mg/dL — ABNORMAL LOW (ref 1.7–2.4)

## 2022-04-02 LAB — MRSA NEXT GEN BY PCR, NASAL: MRSA by PCR Next Gen: NOT DETECTED

## 2022-04-02 LAB — CK: Total CK: 165 U/L (ref 49–397)

## 2022-04-02 MED ORDER — AMOXICILLIN-POT CLAVULANATE 875-125 MG PO TABS: 1.0000 | ORAL_TABLET | Freq: Two times a day (BID) | ORAL | 0 refills | Status: AC

## 2022-04-02 MED ORDER — CYANOCOBALAMIN 500 MCG PO TABS: 500.0000 ug | ORAL_TABLET | Freq: Every day | ORAL | Status: AC

## 2022-04-02 MED ORDER — POTASSIUM CHLORIDE CRYS ER 20 MEQ PO TBCR
20.0000 meq | EXTENDED_RELEASE_TABLET | Freq: Once | ORAL | Status: AC
  Administered 2022-04-02: 20 meq via ORAL
  Filled 2022-04-02: qty 1

## 2022-04-02 MED ORDER — AMOXICILLIN-POT CLAVULANATE 875-125 MG PO TABS
1.0000 | ORAL_TABLET | Freq: Two times a day (BID) | ORAL | Status: DC
  Administered 2022-04-02 – 2022-04-03 (×3): 1 via ORAL
  Filled 2022-04-02 (×3): qty 1

## 2022-04-02 MED ORDER — THIAMINE HCL 100 MG/ML IJ SOLN
500.0000 mg | Freq: Three times a day (TID) | INTRAVENOUS | Status: DC
  Administered 2022-04-02 – 2022-04-03 (×3): 500 mg via INTRAVENOUS
  Filled 2022-04-02 (×6): qty 5

## 2022-04-02 MED ORDER — METOPROLOL SUCCINATE ER 25 MG PO TB24
25.0000 mg | ORAL_TABLET | Freq: Every day | ORAL | Status: DC
  Administered 2022-04-02 – 2022-04-03 (×2): 25 mg via ORAL
  Filled 2022-04-02 (×2): qty 1

## 2022-04-02 MED ORDER — AMLODIPINE BESYLATE 5 MG PO TABS: 5.0000 mg | ORAL_TABLET | Freq: Every day | ORAL | 1 refills | Status: AC

## 2022-04-02 MED ORDER — METOPROLOL SUCCINATE ER 25 MG PO TB24: 25.0000 mg | ORAL_TABLET | Freq: Every day | ORAL | 1 refills | Status: AC

## 2022-04-02 MED ORDER — MAGNESIUM SULFATE 2 GM/50ML IV SOLN
2.0000 g | Freq: Once | INTRAVENOUS | Status: AC
  Administered 2022-04-02: 2 g via INTRAVENOUS
  Filled 2022-04-02: qty 50

## 2022-04-02 NOTE — Progress Notes (Signed)
Patient's sister made aware of patient being discharged. She will be on her way shortly.

## 2022-04-02 NOTE — Progress Notes (Signed)
1334 call time 1340 exam started 1342 exam finished 1343 images sent to [place] exam completed in epic 1347 [facility] radiology called

## 2022-04-02 NOTE — TOC Transition Note (Signed)
Transition of Care [facility]) - CM/SW Discharge Note   Patient Details  Name: John Grant MRN: [id number] Date of Birth: 08/25/1965  Transition of Care (TOC) CM/SW Contact:  Leo Rod, LCSW Phone Number: 04/02/2022, 12:25 PM   Clinical Narrative:    Pt discharging home today, LCSWA discusses Substance abuse with sister and she was in agreement. Added SA info to the ABS. Contacted care [facility] to refer.    Final next level of care: Home/Self Care     Patient Goals and CMS Choice      Discharge Placement                    Name of family member notified: Sister notified. Patient and family notified of of transfer: 04/02/22  Discharge Plan and Services Additional resources added to the After Visit Summary for                                       Social Determinants of Health (SDOH) Interventions SDOH Screenings   Food Insecurity: No Food Insecurity (03/31/2022)  Housing: Low Risk  (03/31/2022)  Transportation Needs: No Transportation Needs (03/31/2022)  Utilities: Not At Risk (03/31/2022)  Financial Resource Strain: Low Risk  (08/15/2018)  Tobacco Use: High Risk (01/24/2020)     Readmission Risk Interventions     No data to display

## 2022-04-02 NOTE — Discharge Summary (Signed)
Physician Discharge Summary   Patient: John Grant MRN: [id number] DOB: 1965/01/20  Admit date:     03/31/2022  Discharge date: 04/02/22  Discharge Physician: Shanon Brow Meyah Corle   PCP: Pcp, No   Recommendations at discharge:   Please follow up with primary care provider within 1-2 weeks  Please repeat BMP and CBC in one week      Hospital Course: 57 year old male with a history of alcohol abuse, polysubstance abuse (previously cocaine), anxiety presenting with confusion and decreased responsiveness.  The patient himself does not recall of the events prior to his admission.  History is supplemented by review of the medical record and speaking with the patient's sister with whom the patient lives.  Apparently, the patient has been his usual self on the evening of 03/30/2022.  However, his sister checked up on him on the afternoon of 03/31/2022 and noted the patient to be confused and not speaking properly.  EMS was activated.  There was concern for right facial droop and right-sided weakness and left gaze preference.  Code stroke was activated.  The patient was seen by Dr.Lindzen who felt that the patient's overall appearance was consistent with encephalopathy secondary to postictal state in conjunction with Todd's paralysis on the right due to an unwitnessed seizure.  Stroke was felt to be less likely as the patient's deficits were improving even during his examination.  The patient had a similar admission in January 2021 when he had elevated blood alcohol level and ammonemia with altered mental status change.  The patient's sister states that the patient has been drinking 4-5 beers almost on a daily basis.  The patient also drinks some wine.  It is unclear exactly when he last ingested alcohol.  However, his alcohol level was 135 in the emergency department. The patient was admitted for further evaluation and treatment.  In the evening of 03/31/2022, the patient had increased agitation.  He was placed  on a Precedex drip.  Assessment and Plan: Sepsis -present on admission -Patient presented with tachycardia, tachypnea, and hypothermia -Secondary to aspiration pneumonitis -Lactic acid peaked at 3.3>>2.4>>0.8 -Check PCT <0.10 -Personally reviewed CXR with right lower lobe opacity -Follow-up blood cultures-neg to date -UA negative for pyuria -sepsis physiology resolved   Acute metabolic encephalopathy -Secondary to alcohol withdrawal, and infectious process -Placed on Precedex drip evening 03/31/2022 -Weaned off Precedex and on 04/01/2022>> patient is more lucid and following commands -123456 -Folic acid -Ammonia 39 -EEG negative for seizure -UDS negative -3/24 mental status back to baseline   Aspiration pneumonitis -Discontinue vancomycin -Continue cefepime D/c home with amox clav x 5 days   Alcohol withdrawal -weaned off Precedex 3/23 -remained lucid since weaning off   Hyperammonemia -Unclear clinical significance -Mental status is improving   Macrocytic anemia -Low Q000111Q replete -Folic acid [id number]  Essential HTN -no on meds PTA -SBP into 180s during hospitalization -started amlodipine and metoprolol succinate during hospitalization   Hypokalemia/Hypomagnesemia -Repleted   Thrombocytopenia -Secondary to alcoholic liver disease -Monitor for signs of bleeding -Repleting 123456 -Folic acid XX123456   Anxiety -Holding Celexa >>pt not taking at home      Consultants: none Procedures performed: none  Disposition: Home Diet recommendation:  Cardiac diet DISCHARGE MEDICATION: Allergies as of 04/02/2022   No Known Allergies      Medication List     STOP taking these medications    citalopram 20 MG tablet Commonly known as: CELEXA   folic acid 1 MG tablet Commonly known as: FOLVITE   Gabapentin (Once-Daily)  300 MG Tabs   hydrOXYzine 25 MG tablet Commonly known as: ATARAX   thiamine 100 MG tablet Commonly known as: VITAMIN B1   traZODone 100 MG  tablet Commonly known as: DESYREL       TAKE these medications    amLODipine 5 MG tablet Commonly known as: NORVASC Take 1 tablet (5 mg total) by mouth daily. Start taking on: April 03, 2022   cyanocobalamin 500 MCG tablet Commonly known as: VITAMIN B12 Take 1 tablet (500 mcg total) by mouth daily. Start taking on: April 03, 2022   metoprolol succinate 25 MG 24 hr tablet Commonly known as: TOPROL-XL Take 1 tablet (25 mg total) by mouth daily. Start taking on: April 03, 2022   multivitamin with minerals Tabs tablet Take 1 tablet by mouth daily.        Discharge Exam: Filed Weights   04/07/22 1600 04/01/22 0515  Weight: 74.4 kg 71 kg   HEENT:  [place]/AT, No thrush, no icterus CV:  RRR, no rub, no S3, no S4 Lung:  CTA, no wheeze, no rhonchi Abd:  soft/+BS, NT Ext:  No edema, no lymphangitis, no synovitis, no rash   Condition at discharge: stable  The results of significant diagnostics from this hospitalization (including imaging, microbiology, ancillary and laboratory) are listed below for reference.   Imaging Studies: EEG adult  Result Date: April 07, 2022 Lora Havens, MD     2022-04-07  4:48 PM Patient Name: John Grant MRN: [id number] Epilepsy Attending: Lora Havens Referring Physician/Provider: Kerney Elbe, MD Date: April 07, 2022 Duration: 24.08 mins Patient history: 57yo M with ams. EEG to evaluate for seizure Level of alertness: lethargic AEDs during EEG study: None Technical aspects: This EEG study was done with scalp electrodes positioned according to the 10-20 International system of electrode placement. Electrical activity was reviewed with band pass filter of 1-70Hz , sensitivity of 7 uV/mm, display speed of 49mm/sec with a 60Hz  notched filter applied as appropriate. EEG data were recorded continuously and digitally stored.  Video monitoring was available and reviewed as appropriate. Description: EEG showed continuous generalized and maximal bifrontal  rhythmic 2-3hz  delta slowing. Hyperventilation and photic stimulation were not performed.   ABNORMALITY - Rhythmic delta activity, generalized and maximal bifrontal IMPRESSION: This study is  suggestive of non-specific cortical dysfunction arising from bifrontal region. Additionally there is severe diffuse encephalopathy, nonspecific etiology. No seizures or epileptiform discharges were seen throughout the recording. If suspicion for interictal activity remains a concern, a prolonged study can be considered. Lora Havens   DG Chest Port 1 View  Result Date: 04/07/2022 CLINICAL DATA:  Possible sepsis EXAM: PORTABLE CHEST 1 VIEW COMPARISON:  01/23/2019 FINDINGS: Transverse diameter of heart is slightly increased. This may be partly due to poor inspiration. There are no signs of pulmonary edema or focal pulmonary consolidation. There is no pleural effusion or pneumothorax. IMPRESSION: There are no signs of pulmonary edema or focal pulmonary consolidation. Electronically Signed   By: Elmer Picker M.D.   On: 04-07-22 16:38   CT HEAD CODE STROKE WO CONTRAST  Result Date: Apr 07, 2022 CLINICAL DATA:  Code stroke. Left-sided weakness, left-sided facial droop, aphasia EXAM: CT HEAD WITHOUT CONTRAST TECHNIQUE: Contiguous axial images were obtained from the base of the skull through the vertex without intravenous contrast. RADIATION DOSE REDUCTION: This exam was performed according to the departmental dose-optimization program which includes automated exposure control, adjustment of the mA and/or kV according to patient size and/or use of iterative reconstruction technique. COMPARISON:  12/04/2019 FINDINGS: Brain:  No evidence of acute infarction, hemorrhage, mass, mass effect, or midline shift. No hydrocephalus or extra-axial collection. Vascular: No hyperdense vessel. Skull: Negative for fracture or focal lesion. Sinuses/Orbits: Mucosal thickening in the right greater than left maxillary sinus and right  sphenoid sinus. Redemonstrated bilateral lamina papyracea fractures. No acute finding in the orbits. Other: The mastoid air cells are well aerated. ASPECTS [facility] Stroke Program Early CT Score) - Ganglionic level infarction (caudate, lentiform nuclei, internal capsule, insula, M1-M3 cortex): 7 - Supraganglionic infarction (M4-M6 cortex): 3 Total score (0-10 with 10 being normal): 10 IMPRESSION: 1. No acute intracranial process. 2. ASPECTS is 10. Code stroke imaging results were communicated on 03/31/2022 at 1:49 pm to provider ZAMMIT via telephone, who verbally acknowledged these results. Electronically Signed   By: Merilyn Baba M.D.   On: 03/31/2022 13:50    Microbiology: Results for orders placed or performed during the hospital encounter of 03/31/22  Blood Culture (routine x 2)     Status: None (Preliminary result)   Collection Time: 03/31/22  2:49 PM   Specimen: BLOOD  Result Value Ref Range Status   Specimen Description BLOOD BLOOD LEFT ARM  Final   Special Requests   Final    BOTTLES DRAWN AEROBIC AND ANAEROBIC Blood Culture adequate volume   Culture   Final    NO GROWTH 2 DAYS Performed at [facility], [address], [place], [place] [address]    Report Status PENDING  Incomplete  Blood Culture (routine x 2)     Status: None (Preliminary result)   Collection Time: 03/31/22  2:50 PM   Specimen: BLOOD  Result Value Ref Range Status   Specimen Description BLOOD BLOOD LEFT HAND  Final   Special Requests   Final    BOTTLES DRAWN AEROBIC AND ANAEROBIC Blood Culture adequate volume   Culture   Final    NO GROWTH 2 DAYS Performed at [facility], [address], [place],  [address]    Report Status PENDING  Incomplete  Resp panel by RT-PCR (RSV, Flu A&B, Covid) Anterior Nasal Swab     Status: None   Collection Time: 03/31/22  6:30 PM   Specimen: Anterior Nasal Swab  Result Value Ref Range Status   SARS Coronavirus 2 by RT PCR NEGATIVE NEGATIVE Final    Comment:  (NOTE) SARS-CoV-2 target nucleic acids are NOT DETECTED.  The SARS-CoV-2 RNA is generally detectable in upper respiratory specimens during the acute phase of infection. The lowest concentration of SARS-CoV-2 viral copies this assay can detect is 138 copies/mL. A negative result does not preclude SARS-Cov-2 infection and should not be used as the sole basis for treatment or other patient management decisions. A negative result may occur with  improper specimen collection/handling, submission of specimen other than nasopharyngeal swab, presence of viral mutation(s) within the areas targeted by this assay, and inadequate number of viral copies(<138 copies/mL). A negative result must be combined with clinical observations, patient history, and epidemiological information. The expected result is Negative.  Fact Sheet for Patients:  EntrepreneurPulse.com.au  Fact Sheet for Healthcare Providers:  IncredibleEmployment.be  This test is no t yet approved or cleared by the [place] FDA and  has been authorized for detection and/or diagnosis of SARS-CoV-2 by FDA under an Emergency Use Authorization (EUA). This EUA will remain  in effect (meaning this test can be used) for the duration of the COVID-19 declaration under Section 564(b)(1) of the Act, 21 U.S.C.section 360bbb-3(b)(1), unless the authorization is terminated  or  revoked sooner.       Influenza A by PCR NEGATIVE NEGATIVE Final   Influenza B by PCR NEGATIVE NEGATIVE Final    Comment: (NOTE) The Xpert Xpress SARS-CoV-2/FLU/RSV plus assay is intended as an aid in the diagnosis of influenza from Nasopharyngeal swab specimens and should not be used as a sole basis for treatment. Nasal washings and aspirates are unacceptable for Xpert Xpress SARS-CoV-2/FLU/RSV testing.  Fact Sheet for Patients: EntrepreneurPulse.com.au  Fact Sheet for Healthcare  Providers: IncredibleEmployment.be  This test is not yet approved or cleared by the [place] FDA and has been authorized for detection and/or diagnosis of SARS-CoV-2 by FDA under an Emergency Use Authorization (EUA). This EUA will remain in effect (meaning this test can be used) for the duration of the COVID-19 declaration under Section 564(b)(1) of the Act, 21 U.S.C. section 360bbb-3(b)(1), unless the authorization is terminated or revoked.     Resp Syncytial Virus by PCR NEGATIVE NEGATIVE Final    Comment: (NOTE) Fact Sheet for Patients: EntrepreneurPulse.com.au  Fact Sheet for Healthcare Providers: IncredibleEmployment.be  This test is not yet approved or cleared by the [place] FDA and has been authorized for detection and/or diagnosis of SARS-CoV-2 by FDA under an Emergency Use Authorization (EUA). This EUA will remain in effect (meaning this test can be used) for the duration of the COVID-19 declaration under Section 564(b)(1) of the Act, 21 U.S.C. section 360bbb-3(b)(1), unless the authorization is terminated or revoked.  Performed at [facility], [address] [place], [place] [address]   Urine Culture     Status: None   Collection Time: 03/31/22  6:36 PM   Specimen: Urine, Clean Catch  Result Value Ref Range Status   Specimen Description   Final    URINE, CLEAN CATCH Performed at [facility], [address], [place], [place] [address]    Special Requests   Final    NONE Performed at [facility], [address] [place], [place] [address]    Culture   Final    NO GROWTH Performed at [facility], [place] [address], [place], [place] [address]    Report Status 04/02/2022 FINAL  Final    Labs: CBC: Recent Labs  Lab 03/31/22 1355 04/01/22 0418 04/02/22 0422  WBC 5.9 7.4 8.4  NEUTROABS 2.8  --   --   HGB 12.3* 10.3* 12.5*  HCT 34.9* 29.8* 36.7*  MCV 105.4* 107.2* 107.9*  PLT 149* 122*  [id number]*   Basic Metabolic Panel: Recent Labs  Lab 03/31/22 1355 04/01/22 0418 04/02/22 0422  NA 140 141 137  K 4.0 2.9* 3.4*  CL 108 114* 110  CO2 22 17* 17*  GLUCOSE 131* 133* 100*  BUN <5* 5* 7  CREATININE 0.71 0.70 0.82  CALCIUM 8.2* 7.7* 8.5*  MG  --   --  1.6*   Liver Function Tests: Recent Labs  Lab 03/31/22 1355 04/01/22 0418 04/02/22 0422  AST 64* 89* 56*  ALT 38 36 34  ALKPHOS 64 57 87  BILITOT 1.5* 1.3* 1.5*  PROT 7.2 6.0* 7.6  ALBUMIN 3.9 3.3* 4.1   CBG: Recent Labs  Lab 04/01/22 0032 04/01/22 0132 04/01/22 0459 04/01/22 0756 04/01/22 1158  GLUCAP 144* 141* 135* 125* 105*    Discharge time spent: greater than 30 minutes.  Signed: Orson Eva, MD [facility] 04/02/2022

## 2022-04-02 NOTE — Progress Notes (Signed)
Patient's sister unable to take patient home today. Patient to stay another night. Dr. Carles Collet aware. Patient to be discharged tomorrow, level of care changed to med-surg.

## 2022-04-03 LAB — COMPREHENSIVE METABOLIC PANEL
ALT: 28 U/L (ref 0–44)
AST: 45 U/L — ABNORMAL HIGH (ref 15–41)
Albumin: 3.2 g/dL — ABNORMAL LOW (ref 3.5–5.0)
Alkaline Phosphatase: 65 U/L (ref 38–126)
Anion gap: 9 (ref 5–15)
BUN: 7 mg/dL (ref 6–20)
CO2: 19 mmol/L — ABNORMAL LOW (ref 22–32)
Calcium: 8 mg/dL — ABNORMAL LOW (ref 8.9–10.3)
Chloride: 111 mmol/L (ref 98–111)
Creatinine, Ser: 0.68 mg/dL (ref 0.61–1.24)
GFR, Estimated: >60 mL/min (ref >60)
Glucose, Bld: 104 mg/dL — ABNORMAL HIGH (ref 70–99)
Potassium: 3.4 mmol/L — ABNORMAL LOW (ref 3.5–5.1)
Sodium: 139 mmol/L (ref 135–145)
Total Bilirubin: 0.6 mg/dL (ref 0.3–1.2)
Total Protein: 6.1 g/dL — ABNORMAL LOW (ref 6.5–8.1)

## 2022-04-03 LAB — GLUCOSE, CAPILLARY: Glucose-Capillary: 143 mg/dL — ABNORMAL HIGH (ref 70–99)

## 2022-04-03 LAB — AMMONIA: Ammonia: 31 umol/L (ref 9–35)

## 2022-04-03 LAB — MAGNESIUM: Magnesium: 1.9 mg/dL (ref 1.7–2.4)

## 2022-04-03 MED ORDER — POTASSIUM CHLORIDE CRYS ER 20 MEQ PO TBCR
20.0000 meq | EXTENDED_RELEASE_TABLET | Freq: Once | ORAL | Status: AC
  Administered 2022-04-03: 20 meq via ORAL
  Filled 2022-04-03: qty 1

## 2022-04-03 MED ORDER — MAGNESIUM OXIDE -MG SUPPLEMENT 400 (240 MG) MG PO TABS
400.0000 mg | ORAL_TABLET | Freq: Every day | ORAL | Status: DC
  Administered 2022-04-03: 400 mg via ORAL
  Filled 2022-04-03: qty 1

## 2022-04-03 NOTE — Plan of Care (Signed)
Patient awake and alert ready for discharge this morning, pain 0/10 ambulatory ate 100% of breakfast meal, Resting quietly in bed with call bell inreach.   Problem: Substance Use Goal: LTG: John Grant will improve quality of life by maintaining ongoing abstinence from all mood-altering substances Outcome: Progressing Goal: LTG: John Grant will increase coping skills to promote long-term recovery and improve ability to perform daily activities Outcome: Progressing Goal: STG: John Grant will adhere to treatment regimen Outcome: Progressing Goal: STG: John Grant will develop a Museum/gallery exhibitions officer (WRAP) Outcome: Progressing Goal: STG: John Grant will attend at least 80% of scheduled IOP sessions  Outcome: Progressing Goal: STG: John Grant will attend at least 80% of scheduled PHP sessions    Outcome: Progressing Goal: STG: John Grant will attend at least 80% of scheduled group psychotherapy sessions  Outcome: Progressing Goal: STG: John Grant will complete at least 80% of assigned homework  Outcome: Progressing Goal: STG: John Grant will attend at least 80% of scheduled follow-up medication management appointments Outcome: Progressing

## 2022-04-03 NOTE — Evaluation (Signed)
Physical Therapy Evaluation Patient Details Name: John Grant MRN: [id number] DOB: 1966/01/02 Today's Date: 04/03/2022  History of Present Illness  John Grant is a 57 y.o. male with medical history significant of alcohol and substance abuse, anxiety/depression. He was brought to the hospital by EMS due to altered mental status. He was last known well last night around 6pm. His sister checked on him at 1pm today - noted that he was confused. EMS was called - they noted right facial droop, right sided weakness and left lateral gaze. He was transported to the hospital for evaluation. Here, he was found to have a negative head CT and was examined by teleneurologist. He was found to be hypothermic and had a lactic acid level of 3.3. Blood cultures were obtained and antibiotics for sepsis was started. An EEG was also ordered.     He had a similar presentation about 5 months ago - aphasia with left sided deficits - and had a negative workup.   Clinical Impression  Patient functioning at baseline for functional mobility and gait demonstrating good return for ambulating in room/hallways without loss of balance.  Plan:  Patient discharged from physical therapy to care of nursing for ambulation daily as tolerated for length of stay.         Recommendations for follow up therapy are one component of a multi-disciplinary discharge planning process, led by the attending physician.  Recommendations may be updated based on patient status, additional functional criteria and insurance authorization.  Follow Up Recommendations       Assistance Recommended at Discharge PRN  Patient can return home with the following  Help with stairs or ramp for entrance    Equipment Recommendations None recommended by PT  Recommendations for Other Services       Functional Status Assessment Patient has not had a recent decline in their functional status     Precautions / Restrictions Precautions Precautions:  None Restrictions Weight Bearing Restrictions: No      Mobility  Bed Mobility Overal bed mobility: Independent                  Transfers Overall transfer level: Independent                      Ambulation/Gait Ambulation/Gait assistance: Modified independent (Device/Increase time), Independent Gait Distance (Feet): 200 Feet Assistive device: None Gait Pattern/deviations: WFL(Within Functional Limits) Gait velocity: near normal     General Gait Details: grossly WFL with good return for ambulating in room, hallways without loss of balance  Stairs            Wheelchair Mobility    Modified Rankin (Stroke Patients Only)       Balance Overall balance assessment: Independent                                           Pertinent Vitals/Pain Pain Assessment Pain Assessment: No/denies pain    Home Living Family/patient expects to be discharged to:: Private residence Living Arrangements: Other relatives Available Help at Discharge: Family;Available PRN/intermittently Type of Home: House Home Access: Stairs to enter Entrance Stairs-Rails: Right;Left;Can reach both Entrance Stairs-Number of Steps: 5 Alternate Level Stairs-Number of Steps: 18 Home Layout: Two level;Bed/bath upstairs Home Equipment: Rolling Walker (2 wheels);Cane - single point      Prior Function Prior Level of Function : Independent/Modified Independent  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 ADLs Comments: Independent     Hand Dominance        Extremity/Trunk Assessment   Upper Extremity Assessment Upper Extremity Assessment: Overall WFL for tasks assessed    Lower Extremity Assessment Lower Extremity Assessment: Overall WFL for tasks assessed    Cervical / Trunk Assessment Cervical / Trunk Assessment: Normal  Communication   Communication: No difficulties  Cognition Arousal/Alertness: Awake/alert Behavior During  Therapy: WFL for tasks assessed/performed Overall Cognitive Status: Within Functional Limits for tasks assessed                                          General Comments      Exercises     Assessment/Plan    PT Assessment Patient does not need any further PT services  PT Problem List         PT Treatment Interventions      PT Goals (Current goals can be found in the Care Plan section)  Acute Rehab PT Goals Patient Stated Goal: return home with family to assist PT Goal Formulation: With patient Time For Goal Achievement: 04/03/22 Potential to Achieve Goals: Good    Frequency       Co-evaluation               AM-PAC PT "6 Clicks" Mobility  Outcome Measure Help needed turning from your back to your side while in a flat bed without using bedrails?: None Help needed moving from lying on your back to sitting on the side of a flat bed without using bedrails?: None Help needed moving to and from a bed to a chair (including a wheelchair)?: None Help needed standing up from a chair using your arms (e.g., wheelchair or bedside chair)?: None Help needed to walk in hospital room?: None Help needed climbing 3-5 steps with a railing? : None 6 Click Score: 24    End of Session   Activity Tolerance: Patient tolerated treatment well Patient left: in bed;with call bell/phone within reach Nurse Communication: Mobility status PT Visit Diagnosis: Unsteadiness on feet (R26.81);Other abnormalities of gait and mobility (R26.89);Muscle weakness (generalized) (M62.81)    Time: [id number] PT Time Calculation (min) (ACUTE ONLY): 11 min   Charges:     PT Treatments $Therapeutic Activity: 8-22 mins        12:26 PM, 04/03/22 Lonell Grandchild, MPT Physical Therapist with [facility] [phone] office [phone] mobile phone

## 2022-04-04 LAB — HEMOGLOBIN A1C
Hgb A1c MFr Bld: 5.8 % — ABNORMAL HIGH (ref 4.8–5.6)
Mean Plasma Glucose: 120 mg/dL

## 2022-04-05 LAB — CULTURE, BLOOD (ROUTINE X 2)
Culture: NO GROWTH
Culture: NO GROWTH
Special Requests: ADEQUATE
Special Requests: ADEQUATE

## 2022-04-07 ENCOUNTER — Telehealth: Payer: Self-pay

## 2022-04-07 NOTE — Telephone Encounter (Signed)
Received referral from Wells Guiles at [place] on Sunday 3/24 to reach out to patient in regards to Care Connect program. I was able to get in touch with patient's sister. She stated that he is currently applying for disability and is unsure if he applied for Medicaid while admitted at [facility]. Patient's sister asked if we can send him an application in the mail to address on file in [employer].

## 2022-05-18 ENCOUNTER — Telehealth: Payer: Self-pay

## 2022-05-18 NOTE — Telephone Encounter (Signed)
attempted to follow up  with pt by way of his Sister, Maryruth Eve, as documented on file by pt.        Sister Marylene Land) did successfully answer and attempted to assist with arranging the pt's first appointment for the free clinic of [facility], however she was unable to confirm the appointment at this time due to needing to  coordinate Briscoe's  transportation to the appointment due to her currently being responsible for caregiving for [facility] at this time.   She states she will follow back up with me to arrange the pt's appt for next week once she coordinates her schedule or another family member's schedule  Prior to ending conversation, his sister requested to get another Medicaid application to assist with helping the pt to apply for Medicaid.  Will await for the pt sister Joycelyn Man) to return call on this week, if not will attempt to follow up again by Monday, 5.13.24

## 2023-01-31 ENCOUNTER — Telehealth: Payer: Self-pay

## 2023-01-31 NOTE — Telephone Encounter (Signed)
Attempted follow up /Wellness call to Care Connect client. Noted that he has not established care with a primary care provider since enrolling in Care Connect in spite of multiple attempts to connect to schedule.  No answer today, unable to leave VM as mailbox is full.  Francee Nodal RN [employer]
